# Patient Record
Sex: Male | Born: 1985
Health system: Southern US, Community
[De-identification: ages and names within clinical notes are randomized; demographics above are authoritative.]

## PROBLEM LIST (undated history)

## (undated) DIAGNOSIS — H9191 Unspecified hearing loss, right ear: Secondary | ICD-10-CM

## (undated) DIAGNOSIS — G4730 Sleep apnea, unspecified: Secondary | ICD-10-CM

## (undated) DIAGNOSIS — R12 Heartburn: Secondary | ICD-10-CM

## (undated) DIAGNOSIS — B009 Herpesviral infection, unspecified: Secondary | ICD-10-CM

## (undated) HISTORY — DX: Herpesviral infection, unspecified: B00.9

## (undated) HISTORY — DX: Heartburn: R12

## (undated) HISTORY — DX: Unspecified hearing loss, right ear: H91.91

## (undated) HISTORY — DX: Sleep apnea, unspecified: G47.30

---

## 2004-07-05 ENCOUNTER — Emergency Department: Payer: Self-pay | Admitting: Emergency Medicine

## 2004-09-25 ENCOUNTER — Emergency Department: Payer: Self-pay | Admitting: Emergency Medicine

## 2004-10-08 ENCOUNTER — Emergency Department: Payer: Self-pay | Admitting: Emergency Medicine

## 2004-10-08 ENCOUNTER — Other Ambulatory Visit: Payer: Self-pay

## 2004-10-24 ENCOUNTER — Emergency Department: Payer: Self-pay | Admitting: Emergency Medicine

## 2004-10-25 ENCOUNTER — Emergency Department: Payer: Self-pay | Admitting: Unknown Physician Specialty

## 2005-02-09 ENCOUNTER — Emergency Department: Payer: Self-pay | Admitting: Emergency Medicine

## 2005-02-10 ENCOUNTER — Emergency Department: Payer: Self-pay | Admitting: Internal Medicine

## 2005-08-25 IMAGING — CT CT HEAD WITHOUT CONTRAST
1 series · 16 of 30 positions shown, 20 images · non-contrast
Comparison: none

REASON FOR EXAM: weakness on left side  [HOSPITAL]
COMMENTS:  LMP: (Male)

PROCEDURE:     CT  - CT HEAD WITHOUT CONTRAST  - October 08, 2004 [DATE]
RESULT:        Unenhanced CT of the brain shows no evidence of acute
intracranial hemorrhage, midline shift or mass effect.  No skull fracture or
subgaleal hematoma.

[Series 2: without · axial · non-contrast · 0.42mm/px · z∈[-125,+15]mm · 16 of 32 slices shown, 20 images]
[im 2/32  brain]
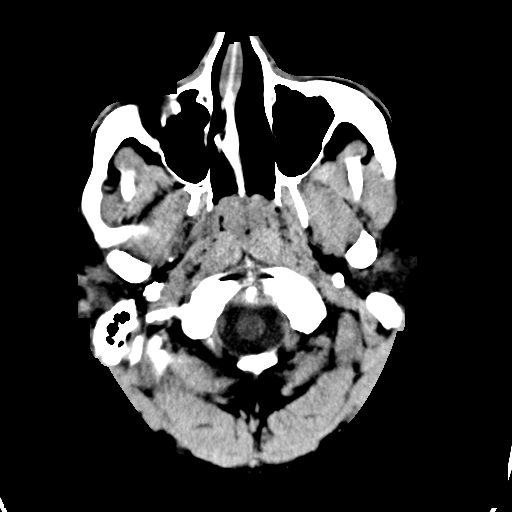
[im 2/32  bone]
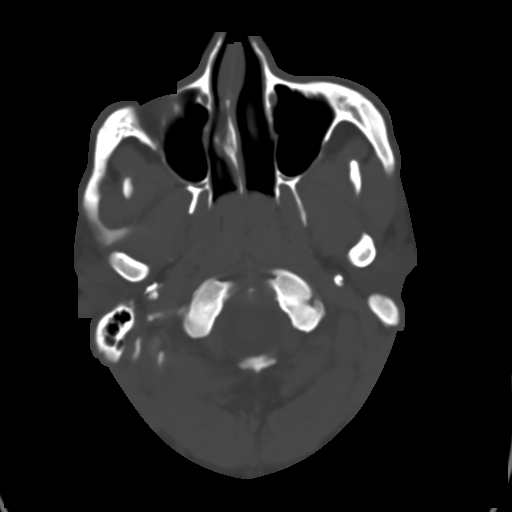
[im 4/32  brain]
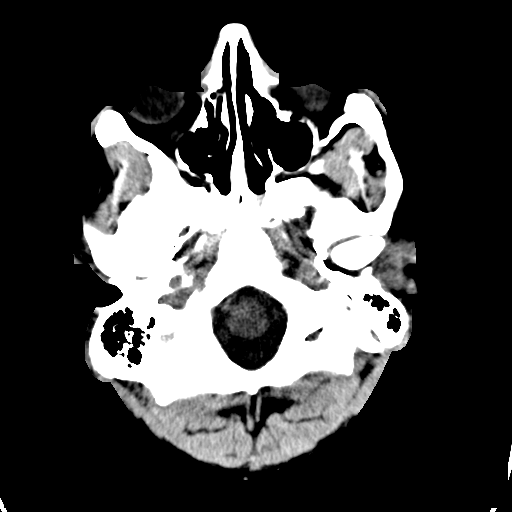
[im 6/32  brain]
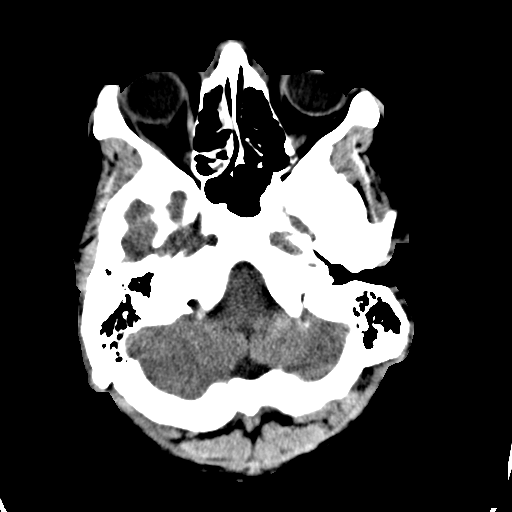
[im 8/32  brain]
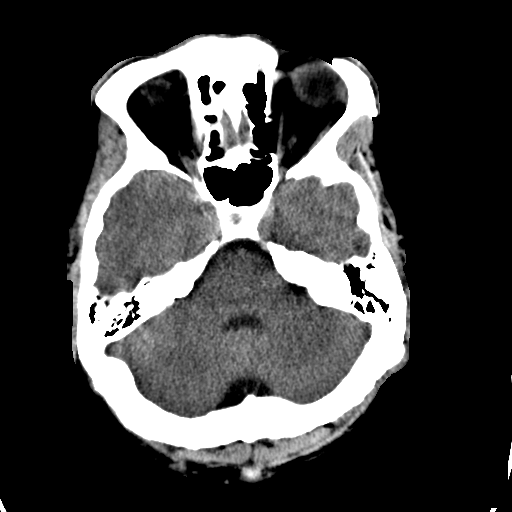
[im 9/32  brain]
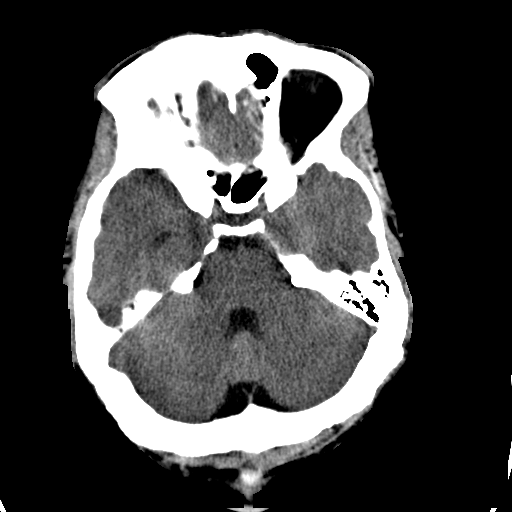
[im 9/32  bone]
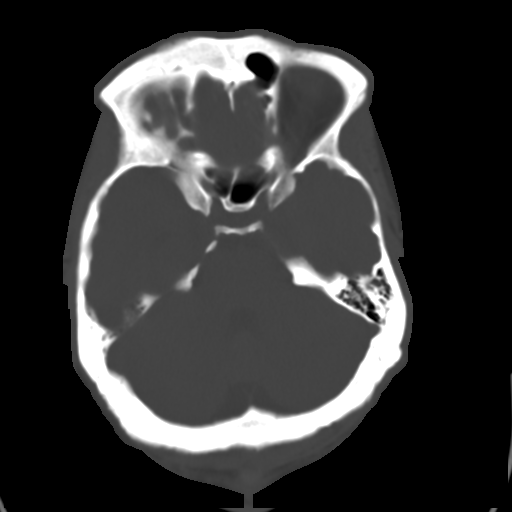
[im 11/32  brain]
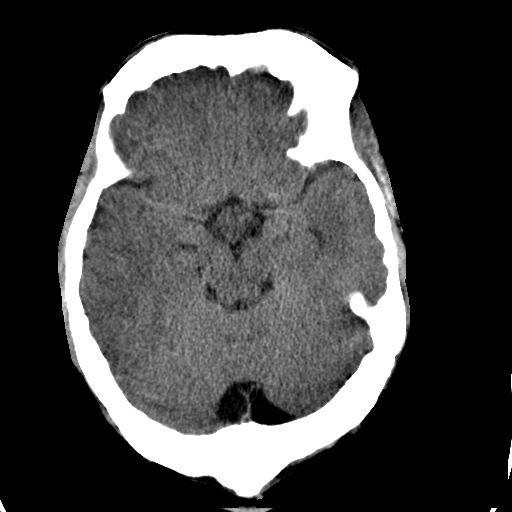
[im 13/32  brain]
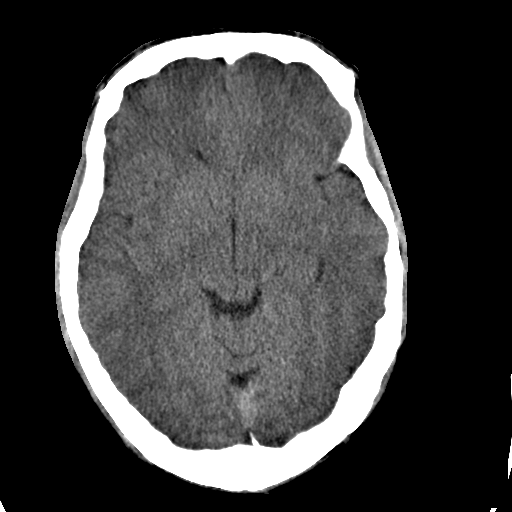
[im 15/32  brain]
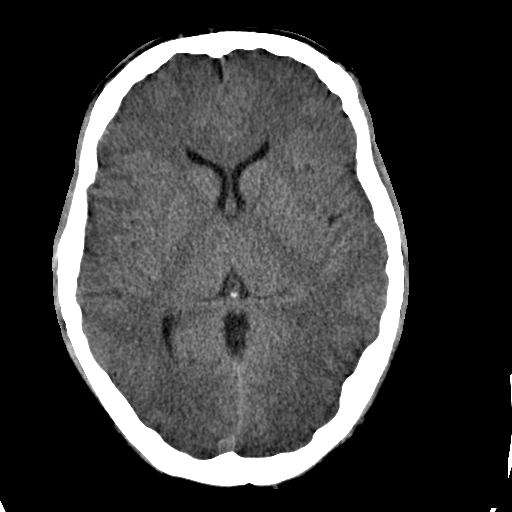
[im 17/32  brain]
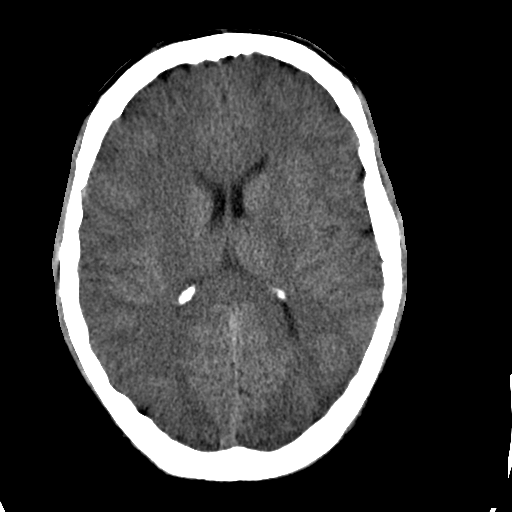
[im 17/32  bone]
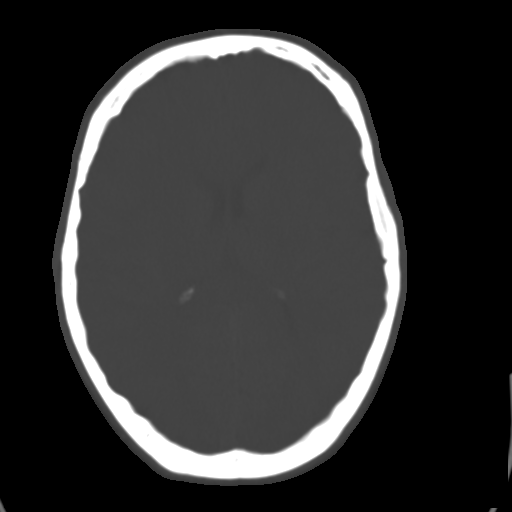
[im 19/32  brain]
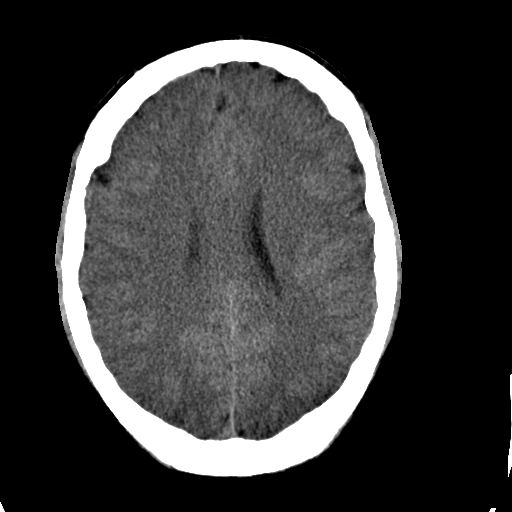
[im 21/32  brain]
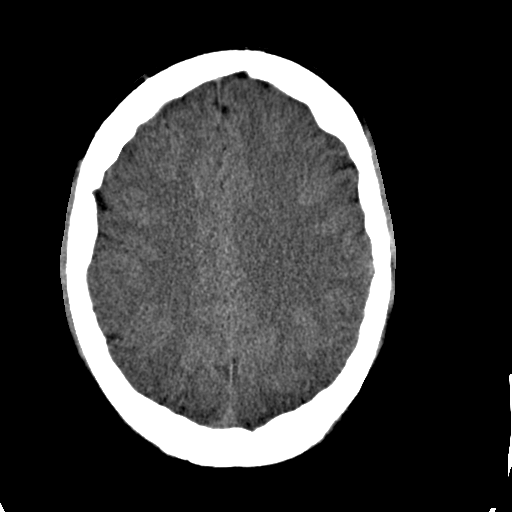
[im 23/32  brain]
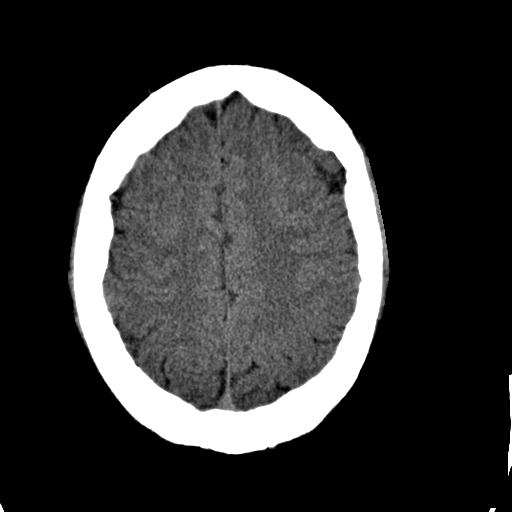
[im 24/32  brain]
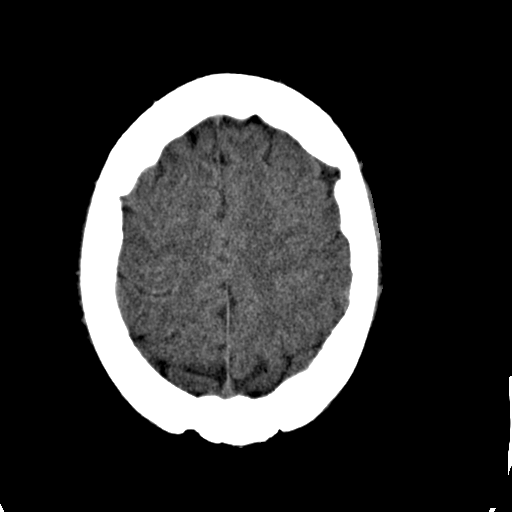
[im 24/32  bone]
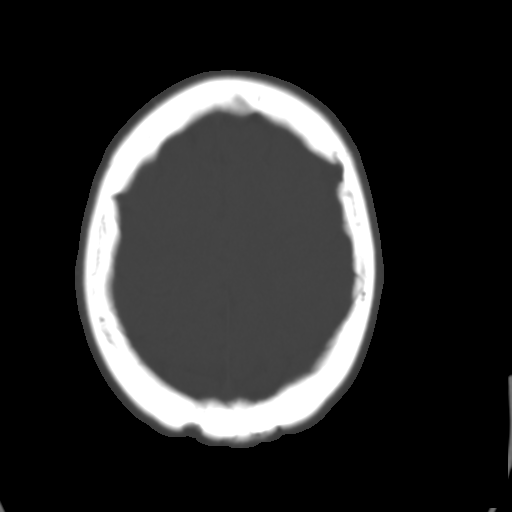
[im 26/32  brain]
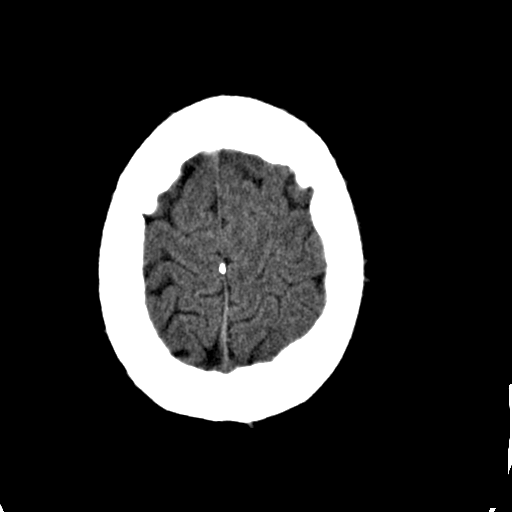
[im 28/32  brain]
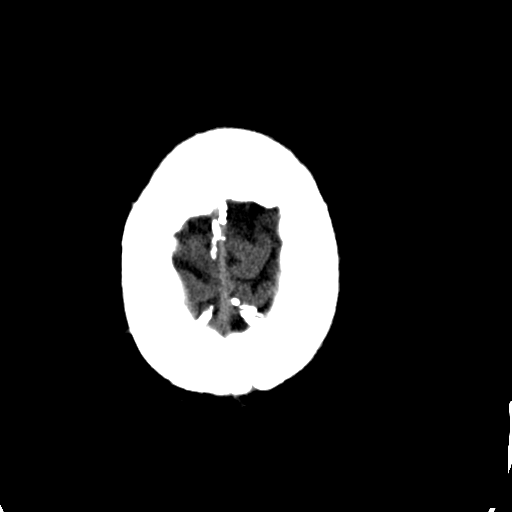
[im 30/32  brain]
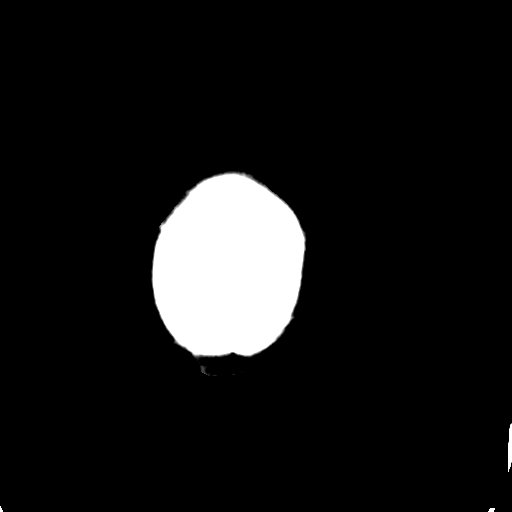

[16 of 30 positions shown; findings below may reference images not displayed]

IMPRESSION: Normal unenhanced CT brain.

## 2005-09-21 ENCOUNTER — Emergency Department: Payer: Self-pay | Admitting: Emergency Medicine

## 2005-09-24 ENCOUNTER — Emergency Department: Payer: Self-pay | Admitting: Unknown Physician Specialty

## 2005-12-28 ENCOUNTER — Emergency Department: Payer: Self-pay

## 2006-08-14 ENCOUNTER — Emergency Department: Payer: Self-pay | Admitting: Emergency Medicine

## 2007-04-05 ENCOUNTER — Emergency Department: Payer: Self-pay | Admitting: Emergency Medicine

## 2010-04-09 ENCOUNTER — Ambulatory Visit: Payer: Self-pay | Admitting: Internal Medicine

## 2010-05-22 ENCOUNTER — Emergency Department: Payer: Self-pay | Admitting: Emergency Medicine

## 2010-06-23 ENCOUNTER — Emergency Department: Payer: Self-pay | Admitting: Emergency Medicine

## 2011-05-12 ENCOUNTER — Emergency Department: Payer: Self-pay

## 2011-06-22 ENCOUNTER — Emergency Department: Payer: Self-pay | Admitting: Emergency Medicine

## 2011-08-19 ENCOUNTER — Emergency Department: Payer: Self-pay | Admitting: Emergency Medicine

## 2012-03-17 ENCOUNTER — Emergency Department: Payer: Self-pay | Admitting: Internal Medicine

## 2013-07-27 ENCOUNTER — Emergency Department: Payer: Self-pay | Admitting: Emergency Medicine

## 2014-11-07 ENCOUNTER — Encounter: Payer: Self-pay | Admitting: Emergency Medicine

## 2014-11-07 ENCOUNTER — Emergency Department
Admission: EM | Admit: 2014-11-07 | Discharge: 2014-11-07 | Disposition: A | Discharge status: Home / Self Care | Payer: Managed Care, Other (non HMO) | Attending: Emergency Medicine | Admitting: Emergency Medicine

## 2014-11-07 DIAGNOSIS — L539 Erythematous condition, unspecified: Secondary | ICD-10-CM | POA: Diagnosis present

## 2014-11-07 DIAGNOSIS — L03114 Cellulitis of left upper limb: Secondary | ICD-10-CM | POA: Diagnosis not present

## 2014-11-07 DIAGNOSIS — Z720 Tobacco use: Secondary | ICD-10-CM | POA: Insufficient documentation

## 2014-11-07 MED ORDER — SULFAMETHOXAZOLE-TRIMETHOPRIM 800-160 MG PO TABS: 2.0000 | ORAL_TABLET | Freq: Two times a day (BID) | ORAL | Status: AC

## 2014-11-07 NOTE — ED Provider Notes (Signed)
CSN: [id number]     Arrival date & time 11/07/14  1511 History   First MD Initiated Contact with Patient 11/07/14 1548     Chief Complaint  Patient presents with  . Skin Ulcer     HPI Comments: 29 year old male presents today complaining of swelling and redness to his left forearm. Reports he had a temporal to the area last week and he popped it. Since then the area has swollen and increased in size almost every day. He has been peeling the scab off and pushing fluid out of it. He has not had any fevers or body aches. No history of MRSA or recurrent cellulitis.  Patient is a 29 y.o. male presenting with rash. The history is provided by the patient.  Rash Location:  Shoulder/arm Shoulder/arm rash location:  L forearm Quality: painful, redness, swelling and weeping   Pain details:    Quality:  Burning   Severity:  Moderate   Onset quality:  Gradual   Duration:  7 days   Timing:  Constant   Progression:  Unchanged Severity:  Mild Onset quality:  Gradual Timing:  Constant Progression:  Unchanged Chronicity:  New Relieved by:  None tried Worsened by:  Contact Ineffective treatments:  None tried Associated symptoms: no fever, no joint pain and no myalgias     History reviewed. No pertinent past medical history. History reviewed. No pertinent past surgical history. No family history on file. History  Substance Use Topics  . Smoking status: Current Every Day Smoker -- 1.00 packs/day    Types: Cigarettes  . Smokeless tobacco: Not on file  . Alcohol Use: No    Review of Systems  Constitutional: Negative for fever and chills.  Musculoskeletal: Negative for myalgias and arthralgias.  Skin: Positive for rash.  All other systems reviewed and are negative.     Allergies  Review of patient's allergies indicates no known allergies.  Home Medications   Prior to Admission medications   Medication Sig Start Date End Date Taking? Authorizing Provider    sulfamethoxazole-trimethoprim (BACTRIM DS) 800-160 MG per tablet Take 2 tablets by mouth 2 (two) times daily. 11/07/14 11/17/14  Wilber Oliphant V, PA-C   BP 128/91 mmHg  Pulse 82  Temp(Src) 98.6 F (37 C) (Oral)  Resp 18  Ht  (1.753 m)  Wt 260 lb (117.935 kg)  BMI 38.38 kg/m2  SpO2 98% Physical Exam  Constitutional: He is oriented to person, place, and time. Vital signs are normal. He appears well-developed and well-nourished. He is active.  Non-toxic appearance. He does not have a sickly appearance. He does not appear ill.  HENT:  Head: Normocephalic and atraumatic.  Musculoskeletal: Normal range of motion. He exhibits no tenderness.  Neurological: He is alert and oriented to person, place, and time.  Skin: Skin is warm and dry. There is erythema.     Psychiatric: He has a normal mood and affect. His behavior is normal. Judgment and thought content normal.  Nursing note and vitals reviewed.   ED Course  Procedures (including critical care time) Labs Review Labs Reviewed - No data to display  Imaging Review No results found.   EKG Interpretation None      MDM  There is no indication for incision and drainage of this area currently. I advised patient to use warm compresses several times per day. He is to take Tylenol or ibuprofen for pain. He is prescribed Bactrim double strength 2 pills twice a day for 10 days. He will  return for increased pain, swelling or increased area of redness. Final diagnoses:  Cellulitis of left upper extremity       Luvenia Redden, PA-C 11/07/14 1604  Darien Ramus, MD 11/07/14 2221

## 2014-11-07 NOTE — ED Notes (Signed)
Pt has abcess to left forearm.  States i had a pimple last week and popped it.  It continues to have some drainage and now it's red and tender.

## 2014-11-07 NOTE — Discharge Instructions (Signed)
Abscess °An abscess (boil or furuncle) is an infected area on or under the skin. This area is filled with yellowish-white fluid (pus) and other material (debris). °HOME CARE  °· Only take medicines as told by your doctor. °· If you were given antibiotic medicine, take it as directed. Finish the medicine even if you start to feel better. °· If gauze is used, follow your doctor's directions for changing the gauze. °· To avoid spreading the infection: °¨ Keep your abscess covered with a bandage. °¨ Wash your hands well. °¨ Do not share personal care items, towels, or whirlpools with others. °¨ Avoid skin contact with others. °· Keep your skin and clothes clean around the abscess. °· Keep all doctor visits as told. °GET HELP RIGHT AWAY IF:  °· You have more pain, puffiness (swelling), or redness in the wound site. °· You have more fluid or blood coming from the wound site. °· You have muscle aches, chills, or you feel sick. °· You have a fever. °MAKE SURE YOU:  °· Understand these instructions. °· Will watch your condition. °· Will get help right away if you are not doing well or get worse. °Document Released: 10/27/2007 Document Revised: 11/09/2011 Document Reviewed: 07/23/2011 °ExitCare® Patient Information ©2015 [employer]. This information is not intended to replace advice given to you by your health care provider. Make sure you discuss any questions you have with your health care provider. ° °

## 2014-11-07 NOTE — ED Notes (Signed)
Red abscess to left forearm, pt states it started out as a pimple, he popped it and now its inflammed, only draining when squeezed.

## 2015-04-15 ENCOUNTER — Ambulatory Visit (INDEPENDENT_AMBULATORY_CARE_PROVIDER_SITE_OTHER): Payer: Managed Care, Other (non HMO) | Admitting: Family Medicine

## 2015-04-15 ENCOUNTER — Encounter: Payer: Self-pay | Admitting: Family Medicine

## 2015-04-15 VITALS — BP 119/81 | HR 73 | Temp 97.0°F | Ht 69.0 in | Wt 263.0 lb

## 2015-04-15 DIAGNOSIS — H9191 Unspecified hearing loss, right ear: Secondary | ICD-10-CM | POA: Diagnosis not present

## 2015-04-15 DIAGNOSIS — Z0000 Encounter for general adult medical examination without abnormal findings: Secondary | ICD-10-CM

## 2015-04-15 DIAGNOSIS — G4733 Obstructive sleep apnea (adult) (pediatric): Secondary | ICD-10-CM | POA: Diagnosis not present

## 2015-04-15 DIAGNOSIS — Z720 Tobacco use: Secondary | ICD-10-CM | POA: Diagnosis not present

## 2015-04-15 DIAGNOSIS — Z9989 Dependence on other enabling machines and devices: Principal | ICD-10-CM

## 2015-04-15 NOTE — Assessment & Plan Note (Signed)
Encouraged cessation.

## 2015-04-15 NOTE — Assessment & Plan Note (Addendum)
Refer to ENT/audiologist for hearing evaluation and hearing aide if appropriate

## 2015-04-15 NOTE — Patient Instructions (Addendum)
There are quitting cessation classes for free at the hospital, [phone] You can also call 1-800-QUIT-NOW for assistance No flavored e-cigarettes Return for fasting labs in the next few weeks We'll get you to an audiologist and we'll get the CPAP ordered I do recommend yearly flu shots; for individuals who don't want flu shots, try to practice excellent hand hygiene, and avoid nursing homes, day cares, and hospitals during peak flu season; taking vitamin C daily during flu/cold season may help boost your immune system too Return in one year for follow-up, physical

## 2015-04-15 NOTE — Progress Notes (Signed)
BP 119/81 mmHg  Pulse 73  Temp(Src) 97 F (36.1 C)  Ht  (1.753 m)  Wt 263 lb (119.296 kg)  BMI 38.82 kg/m2  SpO2 98%   Subjective:    Patient ID: Peter Jones, male    DOB: 02-Oct-1985, 29 y.o.   MRN: [id number]  HPI: MOSIAH BASTIN is a 29 y.o. male  Chief Complaint  Patient presents with  . 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 His mother has diabetes Allergies and medications reviewed and updated.  Review of Systems Per HPI unless specifically indicated above     Objective:    BP 119/81 mmHg  Pulse 73  Temp(Src) 97 F (36.1 C)  Ht  (1.753 m)  Wt 263 lb (119.296 kg)  BMI 38.82 kg/m2  SpO2 98%  Wt Readings from Last 3 Encounters:  04/15/15 263 lb (119.296 kg)  11/07/14 260 lb (117.935 kg)    Physical Exam  Constitutional: He appears well-developed and well-nourished. No distress.  obese  HENT:  Head: Normocephalic and atraumatic.  Eyes: EOM are normal. No scleral icterus.  Cardiovascular: Normal rate and regular rhythm.   Pulmonary/Chest: Effort normal and breath sounds normal.  Musculoskeletal: He exhibits no edema.  Neurological: He is alert.  Skin: No pallor.  Psychiatric: He has a normal mood and affect. His behavior is normal.  Judgment and thought content normal.    No results found for this or any previous visit.    Assessment & Plan:   Problem List Items Addressed This Visit      Respiratory   Obstructive sleep apnea on CPAP - Primary    Get him equipment for CPAP including new machine and tubing; since last sleep study was five years ago and he has hearing loss, will refer to ENT to arrange for treatment, monitoring, prescribing of CPAP equipment; not sure if he would benefit from repeat sleep study since last was five years ago      Relevant Orders   Ambulatory referral to ENT     Other   Tobacco abuse    Encouraged cessation      Hearing loss in right ear    Refer to ENT/audiologist for hearing evaluation and hearing aide if appropriate      Relevant Orders   Ambulatory referral to ENT    Other Visit Diagnoses    Preventative health care        check yearly labs; patient not fasting, but he will return to have these done in the next few weeks    Relevant Orders    CBC with Differential/Platelet    Lipid Panel w/o Chol/HDL Ratio    Comprehensive metabolic panel    TSH  Follow up plan: Return in about 1 year (around 04/14/2016) for complete physical.  An after-visit summary was printed and given to the patient at check-out.  Please see the patient instructions which may contain other information and recommendations beyond what is mentioned above in the assessment and plan.  Orders Placed This Encounter  Procedures  . CBC with Differential/Platelet  . Lipid Panel w/o Chol/HDL Ratio  . Comprehensive metabolic panel  . TSH  . Ambulatory referral to ENT   Face-to-face time with patient was more than 20 minutes, >50% time spent counseling and coordination of care

## 2015-04-15 NOTE — Assessment & Plan Note (Addendum)
Get him equipment for CPAP including new machine and tubing; since last sleep study was five years ago and he has hearing loss, will refer to ENT to arrange for treatment, monitoring, prescribing of CPAP equipment; not sure if he would benefit from repeat sleep study since last was five years ago

## 2015-06-27 ENCOUNTER — Encounter: Payer: Self-pay | Admitting: Family Medicine

## 2015-08-12 ENCOUNTER — Telehealth: Payer: Self-pay | Admitting: Family Medicine

## 2015-08-12 NOTE — Telephone Encounter (Signed)
Left message to come in for fasting labs.

## 2015-08-12 NOTE — Telephone Encounter (Signed)
Please remind patient that he has outstanding lab orders; please have those done in the next week; thank you

## 2016-03-16 ENCOUNTER — Ambulatory Visit (INDEPENDENT_AMBULATORY_CARE_PROVIDER_SITE_OTHER): Payer: Managed Care, Other (non HMO) | Admitting: Family Medicine

## 2016-03-16 ENCOUNTER — Encounter: Payer: Self-pay | Admitting: Family Medicine

## 2016-03-16 DIAGNOSIS — E669 Obesity, unspecified: Secondary | ICD-10-CM | POA: Insufficient documentation

## 2016-03-16 DIAGNOSIS — E6609 Other obesity due to excess calories: Secondary | ICD-10-CM | POA: Diagnosis not present

## 2016-03-16 DIAGNOSIS — Z720 Tobacco use: Secondary | ICD-10-CM | POA: Diagnosis not present

## 2016-03-16 DIAGNOSIS — Z6834 Body mass index (BMI) 34.0-34.9, adult: Secondary | ICD-10-CM | POA: Diagnosis not present

## 2016-03-16 DIAGNOSIS — Z113 Encounter for screening for infections with a predominantly sexual mode of transmission: Secondary | ICD-10-CM | POA: Insufficient documentation

## 2016-03-16 DIAGNOSIS — M545 Low back pain, unspecified: Secondary | ICD-10-CM | POA: Insufficient documentation

## 2016-03-16 DIAGNOSIS — G4733 Obstructive sleep apnea (adult) (pediatric): Secondary | ICD-10-CM

## 2016-03-16 DIAGNOSIS — Z9989 Dependence on other enabling machines and devices: Secondary | ICD-10-CM

## 2016-03-16 DIAGNOSIS — G8929 Other chronic pain: Secondary | ICD-10-CM | POA: Diagnosis not present

## 2016-03-16 DIAGNOSIS — M7652 Patellar tendinitis, left knee: Secondary | ICD-10-CM | POA: Diagnosis not present

## 2016-03-16 LAB — COMPLETE METABOLIC PANEL WITH GFR
ALK PHOS: 30 U/L — AB (ref 40–115)
ALT: 19 U/L (ref 9–46)
AST: 18 U/L (ref 10–40)
Albumin: 4.3 g/dL (ref 3.6–5.1)
BUN: 8 mg/dL (ref 7–25)
CALCIUM: 9.4 mg/dL (ref 8.6–10.3)
CO2: 25 mmol/L (ref 20–31)
Chloride: 104 mmol/L (ref 98–110)
Creat: 0.99 mg/dL (ref 0.60–1.35)
GFR, Est African American: >89 mL/min (ref >=60)
Glucose, Bld: 89 mg/dL (ref 65–99)
POTASSIUM: 4.6 mmol/L (ref 3.5–5.3)
Sodium: 139 mmol/L (ref 135–146)
Total Bilirubin: 0.3 mg/dL (ref 0.2–1.2)
Total Protein: 6.9 g/dL (ref 6.1–8.1)

## 2016-03-16 LAB — LIPID PANEL
Cholesterol: 93 mg/dL — ABNORMAL LOW (ref 125–200)
HDL: 20 mg/dL — ABNORMAL LOW (ref >=40)
LDL Cholesterol: 43 mg/dL (ref <130)
TRIGLYCERIDES: 152 mg/dL — AB (ref <150)
Total CHOL/HDL Ratio: 4.7 Ratio (ref <=5.0)
VLDL: 30 mg/dL (ref <30)

## 2016-03-16 NOTE — Progress Notes (Signed)
BP 132/80   Pulse 80   Temp 98.2 F (36.8 C) (Oral)   Resp 16   Wt 260 lb (117.9 kg)   SpO2 96%   BMI 38.40 kg/m    Subjective:    Patient ID: Peter Jones, male    DOB: 26-Nov-1985, 30 y.o.   MRN: [id number]  HPI: Peter Jones is a 30 y.o. male  Chief Complaint  Patient presents with  . Follow-up   He is here for follow-up and will do some fasting labs, last food intake was 1:15 am; did drink some coffee and had some coffee, 3 tablespoons of sugar Feeling good overall Never completed the paperwork for CPAP; he just needs to get the paperwork signed to get the machine, had the study and has the settings and all; weight is overall stable since last year Obesity; he got back into the gym, got down to 246 but then stopped going for a month and gained back up; diet was pretty much the same; will plan to get back into the gym; no CP or SHOB; watches heart rate on the machines He just got out of a relationship; relations with another woman; asx; using condoms Blood pressure elevated today; nervous about asking about STDs Left knee at times will have a sharp burning pain across the patella in the midline, above and below; 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 Deafness in right ear    50%  . Heart burn   . Sleep apnea    History reviewed. No pertinent surgical history. Family History  Problem Relation Age of Onset  . COPD Mother   . Diabetes Mother   . Heart disease Mother    . Hypertension Mother   . Cancer Father   . Stroke Neg Hx    Social History  Substance Use Topics  . Smoking status: Current Every Day Smoker    Packs/day: 1.00    Years: 13.00    Types: Cigarettes  . Smokeless tobacco: Never Used  . Alcohol use No  MD note: 1 ppd, not ready to quit right now  Interim medical history since last visit reviewed. Allergies and medications reviewed  Review of Systems Per HPI unless specifically indicated above     Objective:    BP 132/80   Pulse 80   Temp 98.2 F (36.8 C) (Oral)   Resp 16   Wt 260 lb (117.9 kg)   SpO2 96%   BMI 38.40 kg/m   Wt Readings from Last 3 Encounters:  03/16/16 260 lb (117.9 kg)  04/15/15 263 lb (119.3 kg)  11/07/14 260 lb (117.9 kg)    Physical Exam  Constitutional: He appears well-developed and well-nourished. No distress.  HENT:  Head: Normocephalic and atraumatic.  Eyes: EOM are normal. No scleral icterus.  Neck: No thyromegaly present.  Cardiovascular: Normal rate and regular rhythm.   Pulmonary/Chest: Effort normal and breath sounds normal.  Abdominal: Soft. Bowel sounds are  normal. He exhibits no distension.  Musculoskeletal: He exhibits no edema.       Left knee: He exhibits normal range of motion, no swelling, no effusion, no deformity and normal alignment. No tenderness found.       Lumbar back: He exhibits normal range of motion, no tenderness, no bony tenderness, no swelling and no deformity.  Neurological: Coordination normal.  Skin: Skin is warm and dry. No pallor.  Psychiatric: He has a normal mood and affect. His behavior is normal. Judgment and thought content normal.   No results found for this or any previous visit.    Assessment & Plan:   Problem List Items Addressed This Visit      Respiratory   Obstructive sleep apnea on CPAP    Patient does not have machine; I urged him to get his paperwork filled out to get back on CPAP; I asked if there was anything we could do to help and  he says he'll do it        Musculoskeletal and Integument   Patellar tendonitis of left knee    encoruaged exercises; NSAID per package directions        Other   Tobacco abuse    Patient is not ready to quit; I am here to help with Chantix or other Rx if needed; see AVS for QUITNOW # and smoking cessation class # given      Screen for STD (sexually transmitted disease)    Encouraged condom use to prevent STDs; will check labs today      Relevant Orders   RPR   Hepatitis panel, acute   GC/Chlamydia Probe Amp   HIV antibody   Obesity    Check glucose and lipids      Relevant Orders   COMPLETE METABOLIC PANEL WITH GFR   Lipid panel   Low back pain    Check urine; offered PT for strengthening; pt politely declined, but I'll leave open invitation on chart      Relevant Orders   Urinalysis w microscopic + reflex cultur    Other Visit Diagnoses   None.      Follow up plan: Return in about 6 months (around 09/14/2016) for complete physical.  An after-visit summary was printed and given to the patient at check-out.  Please see the patient instructions which may contain other information and recommendations beyond what is mentioned above in the assessment and plan.  No orders of the defined types were placed in this encounter.   Orders Placed This Encounter  Procedures  . GC/Chlamydia Probe Amp  . COMPLETE METABOLIC PANEL WITH GFR  . RPR  . Hepatitis panel, acute  . HIV antibody  . Lipid panel  . Urinalysis w microscopic + reflex cultur

## 2016-03-16 NOTE — Assessment & Plan Note (Signed)
Encouraged condom use to prevent STDs; will check labs today

## 2016-03-16 NOTE — Assessment & Plan Note (Signed)
Check urine; offered PT for strengthening; pt politely declined, but I'll leave open invitation on chart

## 2016-03-16 NOTE — Assessment & Plan Note (Signed)
encoruaged exercises; NSAID per package directions

## 2016-03-16 NOTE — Assessment & Plan Note (Signed)
Patient is not ready to quit; I am here to help with Chantix or other Rx if needed; see AVS for QUITNOW # and smoking cessation class # given

## 2016-03-16 NOTE — Assessment & Plan Note (Signed)
Check glucose and lipids 

## 2016-03-16 NOTE — Assessment & Plan Note (Signed)
Patient does not have machine; I urged him to get his paperwork filled out to get back on CPAP; I asked if there was anything we could do to help and he says he'll do it

## 2016-03-16 NOTE — Patient Instructions (Addendum)
I do encourage you to quit smoking Call [phone] to sign up for smoking cessation classes You can call 1-800-QUIT-NOW to talk with a smoking cessation coach  Please do contact the sleep lab about getting CPAP  For your knee, do the exercises daily; use over-the-counter NSAIDs per package directions and ice topically 15-20 minutes 3-4 times a day for 4-7 days to help relieve flares  We'll get labs today If you have not heard anything from my staff in a week about any orders/referrals/studies from today, please contact us here to follow-up [phone]  Your goal blood pressure is less than 140 mmHg on top. Try to follow the DASH guidelines (DASH stands for Dietary Approaches to Stop Hypertension) Try to limit the sodium in your diet.  Ideally, consume less than 1.5 grams (less than 1,500mg ) per day. Do not add salt when cooking or at the table.  Check the sodium amount on labels when shopping, and choose items lower in sodium when given a choice. Avoid or limit foods that already contain a lot of sodium. Eat a diet rich in fruits and vegetables and whole grains.  Check out the information at familydoctor.org entitled "Nutrition for Weight Loss: What You Need to Know about Fad Diets" Try to lose between 1-2 pounds per week by taking in fewer calories and burning off more calories You can succeed by limiting portions, limiting foods dense in calories and fat, becoming more active, and drinking 8 glasses of water a day (64 ounces) Don't skip meals, especially breakfast, as skipping meals may alter your metabolism Do not use over-the-counter weight loss pills or gimmicks that claim rapid weight loss A healthy BMI (or body mass index) is between 18.5 and 24.9 You can calculate your ideal BMI at the NIH website [employer]huhttp://www.nhlbi.nih.gov/health/educational/lose_wt/BMI/bmicalc.htm

## 2016-03-17 LAB — URINALYSIS W MICROSCOPIC + REFLEX CULTURE
BACTERIA UA: NONE SEEN [HPF]
Bilirubin Urine: NEGATIVE
CRYSTALS: NONE SEEN [HPF]
Casts: NONE SEEN [LPF]
GLUCOSE, UA: NEGATIVE
Hgb urine dipstick: NEGATIVE
Ketones, ur: NEGATIVE
LEUKOCYTES UA: NEGATIVE
Nitrite: NEGATIVE
Protein, ur: NEGATIVE
RBC / HPF: NONE SEEN RBC/HPF (ref <=2)
Specific Gravity, Urine: 1.011 (ref 1.001–1.035)
Squamous Epithelial / LPF: NONE SEEN [HPF] (ref <=5)
WBC, UA: NONE SEEN WBC/HPF (ref <=5)
YEAST: NONE SEEN [HPF]
pH: 7 (ref 5.0–8.0)

## 2016-03-17 LAB — HEPATITIS PANEL, ACUTE
HCV Ab: NEGATIVE
HEP B C IGM: NONREACTIVE
HEP B S AG: NEGATIVE
Hep A IgM: NONREACTIVE

## 2016-03-17 LAB — RPR

## 2016-03-17 LAB — HIV ANTIBODY (ROUTINE TESTING W REFLEX): HIV 1&2 Ab, 4th Generation: NONREACTIVE

## 2016-03-17 LAB — GC/CHLAMYDIA PROBE AMP
CT PROBE, AMP APTIMA: NOT DETECTED
GC Probe RNA: NOT DETECTED

## 2016-03-31 ENCOUNTER — Encounter: Payer: Self-pay | Admitting: Family Medicine

## 2016-03-31 DIAGNOSIS — Z8679 Personal history of other diseases of the circulatory system: Secondary | ICD-10-CM | POA: Insufficient documentation

## 2016-03-31 DIAGNOSIS — I517 Cardiomegaly: Secondary | ICD-10-CM | POA: Insufficient documentation

## 2016-04-05 ENCOUNTER — Encounter: Payer: Self-pay | Admitting: Family Medicine

## 2016-04-14 ENCOUNTER — Other Ambulatory Visit: Payer: Self-pay | Admitting: Family Medicine

## 2016-04-14 DIAGNOSIS — Z9989 Dependence on other enabling machines and devices: Principal | ICD-10-CM

## 2016-04-14 DIAGNOSIS — G4733 Obstructive sleep apnea (adult) (pediatric): Secondary | ICD-10-CM

## 2016-04-14 NOTE — Assessment & Plan Note (Signed)
Refer to ENT for OSA, CPAP

## 2016-06-07 ENCOUNTER — Telehealth: Payer: Self-pay

## 2016-06-07 NOTE — Telephone Encounter (Signed)
Error

## 2016-06-10 ENCOUNTER — Ambulatory Visit: Payer: Self-pay | Admitting: Family Medicine

## 2016-06-17 ENCOUNTER — Encounter: Payer: Self-pay | Admitting: Family Medicine

## 2016-06-17 ENCOUNTER — Ambulatory Visit (INDEPENDENT_AMBULATORY_CARE_PROVIDER_SITE_OTHER): Payer: Commercial Managed Care - PPO | Admitting: Family Medicine

## 2016-06-17 VITALS — BP 114/78 | HR 82 | Temp 98.1°F | Resp 14 | Wt 261.4 lb

## 2016-06-17 DIAGNOSIS — E6609 Other obesity due to excess calories: Secondary | ICD-10-CM

## 2016-06-17 DIAGNOSIS — Z113 Encounter for screening for infections with a predominantly sexual mode of transmission: Secondary | ICD-10-CM | POA: Diagnosis not present

## 2016-06-17 DIAGNOSIS — Z6838 Body mass index (BMI) 38.0-38.9, adult: Secondary | ICD-10-CM

## 2016-06-17 DIAGNOSIS — Z114 Encounter for screening for human immunodeficiency virus [HIV]: Secondary | ICD-10-CM

## 2016-06-17 DIAGNOSIS — K909 Intestinal malabsorption, unspecified: Secondary | ICD-10-CM

## 2016-06-17 DIAGNOSIS — K9049 Malabsorption due to intolerance, not elsewhere classified: Secondary | ICD-10-CM | POA: Insufficient documentation

## 2016-06-17 LAB — HEPATITIS PANEL, ACUTE
HCV AB: NEGATIVE
HEP B S AG: NEGATIVE
Hep A IgM: NONREACTIVE
Hep B C IgM: NONREACTIVE

## 2016-06-17 NOTE — Assessment & Plan Note (Signed)
Check labs today; condom use encouraged every sexual encounter

## 2016-06-17 NOTE — Assessment & Plan Note (Signed)
Try going completely dairy free for 4 weeks; if still having mucous build-up, contact me

## 2016-06-17 NOTE — Assessment & Plan Note (Signed)
Encouraged weight loss in AVS

## 2016-06-17 NOTE — Progress Notes (Signed)
BP 114/78   Pulse 82   Temp 98.1 F (36.7 C) (Oral)   Resp 14   Wt 261 lb 6 oz (118.6 kg)   SpO2 97%   BMI 38.60 kg/m    Subjective:    Patient ID: Peter Jones, male    DOB: 05/04/1986, 31 y.o.   MRN: [id number]  HPI: Peter Jones is a 31 y.o. male  Chief Complaint  Patient presents with  . Exposure to STD    STD check through blood work    He is here for STD check; no symptoms He says that his partner had something that can cause cervical cancer She also has herpes as well No genital warts, just ingrown hair No blisters or ulcers; partner not active outbreak Used a condom some of the times No burning with urination No fevers No swollen glands He also has increased mucous production after eating, things like bologna and cheese  Depression screen [facility] 2/9 06/17/2016 03/16/2016 04/15/2015  Decreased Interest 0 0 0  Down, Depressed, Hopeless 0 0 0  PHQ - 2 Score 0 0 0   Relevant past medical, surgical, family and social history reviewed Past Medical History:  Diagnosis Date  . Deafness in right ear    50%  . Heart burn   . Sleep apnea    History reviewed. No pertinent surgical history.   Family History  Problem Relation Age of Onset  . COPD Mother   . Diabetes Mother   . Heart disease Mother   . Hypertension Mother   . Cancer Father   . Stroke Neg Hx    Social History  Substance Use Topics  . Smoking status: Current Every Day Smoker    Packs/day: 1.00    Years: 13.00    Types: Cigarettes  . Smokeless tobacco: Never Used  . Alcohol use No   Interim medical history since last visit reviewed. Allergies and medications reviewed  Review of Systems Per HPI unless specifically indicated above     Objective:    BP 114/78   Pulse 82   Temp 98.1 F (36.7 C) (Oral)   Resp 14   Wt 261 lb 6 oz (118.6 kg)   SpO2 97%   BMI 38.60 kg/m   Wt Readings from Last 3 Encounters:  06/17/16 261 lb 6 oz (118.6 kg)  03/16/16 260 lb (117.9 kg)  04/15/15 263  lb (119.3 kg)    Physical Exam  Constitutional: He appears well-developed and well-nourished. No distress.  obese  Eyes: No scleral icterus.  Cardiovascular: Normal rate and regular rhythm.   Pulmonary/Chest: Effort normal and breath sounds normal.  Neurological: He is alert.  Psychiatric: He has a normal mood and affect.   Results for orders placed or performed in visit on 03/16/16  GC/Chlamydia Probe Amp  Result Value Ref Range   CT Probe RNA NOT DETECTED    GC Probe RNA NOT DETECTED   COMPLETE METABOLIC PANEL WITH GFR  Result Value Ref Range   Sodium 139 135 - 146 mmol/L   Potassium 4.6 3.5 - 5.3 mmol/L   Chloride 104 98 - 110 mmol/L   CO2 25 20 - 31 mmol/L   Glucose, Bld 89 65 - 99 mg/dL   BUN 8 7 - 25 mg/dL   Creat 4.09 8.11 - 9.14 mg/dL   Total Bilirubin 0.3 0.2 - 1.2 mg/dL   Alkaline Phosphatase 30 (L) 40 - 115 U/L   AST 18 10 - 40 U/L  ALT 19 9 - 46 U/L   Total Protein 6.9 6.1 - 8.1 g/dL   Albumin 4.3 3.6 - 5.1 g/dL   Calcium 9.4 8.6 - 53.610.3 mg/dL   GFR, Est African American >89 >=60 mL/min   GFR, Est Non African American >89 >=60 mL/min  RPR  Result Value Ref Range   RPR Ser Ql NON REAC NON REAC  Hepatitis panel, acute  Result Value Ref Range   Hepatitis B Surface Ag NEGATIVE NEGATIVE   HCV Ab NEGATIVE NEGATIVE   Hep B C IgM NON REACTIVE NON REACTIVE   Hep A IgM NON REACTIVE NON REACTIVE  HIV antibody  Result Value Ref Range   HIV 1&2 Ab, 4th Generation NONREACTIVE NONREACTIVE  Lipid panel  Result Value Ref Range   Cholesterol 93 (L) 125 - 200 mg/dL   Triglycerides 644152 (H) <150 mg/dL   HDL 20 (L) >=03>=40 mg/dL   Total CHOL/HDL Ratio 4.7 <=5.0 Ratio   VLDL 30 <30 mg/dL   LDL Cholesterol 43 <474<130 mg/dL  Urinalysis w microscopic + reflex cultur  Result Value Ref Range   Color, Urine YELLOW YELLOW   APPearance CLEAR CLEAR   Specific Gravity, Urine 1.011 1.001 - 1.035   pH 7.0 5.0 - 8.0   Glucose, UA NEGATIVE NEGATIVE   Bilirubin Urine NEGATIVE NEGATIVE    Ketones, ur NEGATIVE NEGATIVE   Hgb urine dipstick NEGATIVE NEGATIVE   Protein, ur NEGATIVE NEGATIVE   Nitrite NEGATIVE NEGATIVE   Leukocytes, UA NEGATIVE NEGATIVE   WBC, UA NONE SEEN <=5 WBC/HPF   RBC / HPF NONE SEEN <=2 RBC/HPF   Squamous Epithelial / LPF NONE SEEN <=5 HPF   Bacteria, UA NONE SEEN NONE SEEN HPF   Crystals NONE SEEN NONE SEEN HPF   Casts NONE SEEN NONE SEEN LPF   Yeast NONE SEEN NONE SEEN HPF      Assessment & Plan:   Problem List Items Addressed This Visit      Other   Screen for STD (sexually transmitted disease)    Check labs today; condom use encouraged every sexual encounter      Relevant Orders   RPR   GC/Chlamydia Probe Amp   Urine cytology ancillary only   HIV antibody   Hepatitis, Acute   HSV(herpes simplex vrs) 1+2 ab-IgG   HSV(herpes simplex vrs) 1+2 ab-IgM   Obesity    Encouraged weight loss in AVS      Encounter for screening for HIV    Screen for HIV      Relevant Orders   HIV antibody   Dairy product intolerance - Primary    Try going completely dairy free for 4 weeks; if still having mucous build-up, contact me         Follow up plan: Return if symptoms worsen or fail to improve.  An after-visit summary was printed and given to the patient at check-out.  Please see the patient instructions which may contain other information and recommendations beyond what is mentioned above in the assessment and plan.  No orders of the defined types were placed in this encounter.   Orders Placed This Encounter  Procedures  . GC/Chlamydia Probe Amp  . RPR  . HIV antibody  . Hepatitis, Acute  . HSV(herpes simplex vrs) 1+2 ab-IgG  . HSV(herpes simplex vrs) 1+2 ab-IgM

## 2016-06-17 NOTE — Assessment & Plan Note (Signed)
Screen for HIV

## 2016-06-17 NOTE — Patient Instructions (Addendum)
Try going completely dairy free for 4 weeks If mucous production still an issue, contact me I'll encourage condom use with every sexual encounter Check out the information at familydoctor.org entitled "Nutrition for Weight Loss: What You Need to Know about Fad Diets" Try to lose between 1-2 pounds per week by taking in fewer calories and burning off more calories You can succeed by limiting portions, limiting foods dense in calories and fat, becoming more active, and drinking 8 glasses of water a day (64 ounces) Don't skip meals, especially breakfast, as skipping meals may alter your metabolism Do not use over-the-counter weight loss pills or gimmicks that claim rapid weight loss A healthy BMI (or body mass index) is between 18.5 and 24.9 You can calculate your ideal BMI at the NIH website JobE[employer]ics.hu   Sexually Transmitted Disease A sexually transmitted disease (STD) is a disease or infection often passed to another person during sex. However, STDs can be passed through nonsexual ways. An STD can be passed through:  Spit (saliva).  Semen.  Blood.  Mucus from the vagina.  Pee (urine). How can I lessen my chances of getting an STD?  Use:  Latex condoms.  Water-soluble lubricants with condoms. Do not use petroleum jelly or oils.  Dental dams. These are small pieces of latex that are used as a barrier during oral sex.  Avoid having more than one sex partner.  Do not have sex with someone who has other sex partners.  Do not have sex with anyone you do not know or who is at high risk for an STD.  Avoid risky sex that can break your skin.  Do not have sex if you have open sores on your mouth or skin.  Avoid drinking too much alcohol or taking illegal drugs. Alcohol and drugs can affect your good judgment.  Avoid oral and anal sex acts.  Get shots (vaccines) for HPV and hepatitis.  If you are at risk of being infected  with HIV, it is advised that you take a certain medicine daily to prevent HIV infection. This is called pre-exposure prophylaxis (PrEP). You may be at risk if:  You are a man who has sex with other men (MSM).  You are attracted to the opposite sex (heterosexual) and are having sex with more than one partner.  You take drugs with a needle.  You have sex with someone who has HIV.  Talk with your doctor about if you are at high risk of being infected with HIV. If you begin to take PrEP, get tested for HIV first. Get tested every 3 months for as long as you are taking PrEP.  Get tested for STDs every year if you are sexually active. If you are treated for an STD, get tested again 3 months after you are treated. What should I do if I think I have an STD?  See your doctor.  Tell your sex partner(s) that you have an STD. They should be tested and treated.  Do not have sex until your doctor says it is okay. When should I get help? Get help right away if:  You have bad belly (abdominal) pain.  You are a man and have puffiness (swelling) or pain in your testicles.  You are a woman and have puffiness in your vagina. This information is not intended to replace advice given to you by your health care provider. Make sure you discuss any questions you have with your health care provider. Document Released: 06/17/2004 Document  Revised: 10/16/2015 Document Reviewed: 11/03/2012 Elsevier Interactive Patient Education  2017 [employer]

## 2016-06-18 LAB — TRICHOMONAS VAGINALIS RNA, QL,MALES: Trichomonas vaginalis RNA: NOT DETECTED

## 2016-06-18 LAB — RPR

## 2016-06-18 LAB — GC/CHLAMYDIA PROBE AMP
CT Probe RNA: NOT DETECTED
GC Probe RNA: NOT DETECTED

## 2016-06-18 LAB — HSV(HERPES SIMPLEX VRS) I + II AB-IGG
HSV 1 Glycoprotein G Ab, IgG: 47.6 Index — ABNORMAL HIGH (ref <0.90)
HSV 2 Glycoprotein G Ab, IgG: <0.9 Index (ref <0.90)

## 2016-06-18 LAB — HIV ANTIBODY (ROUTINE TESTING W REFLEX): HIV 1&2 Ab, 4th Generation: NONREACTIVE

## 2016-06-20 LAB — HSV 1/2 AB (IGM), IFA W/RFLX TITER
HSV 1 IGM SCREEN: NEGATIVE
HSV 2 IGM SCREEN: NEGATIVE

## 2016-07-09 ENCOUNTER — Ambulatory Visit: Payer: Commercial Managed Care - PPO | Attending: Otolaryngology

## 2016-07-09 DIAGNOSIS — F5101 Primary insomnia: Secondary | ICD-10-CM | POA: Insufficient documentation

## 2016-07-09 DIAGNOSIS — R0683 Snoring: Secondary | ICD-10-CM | POA: Insufficient documentation

## 2016-07-09 DIAGNOSIS — G4700 Insomnia, unspecified: Secondary | ICD-10-CM | POA: Diagnosis present

## 2016-09-15 ENCOUNTER — Encounter: Payer: Managed Care, Other (non HMO) | Admitting: Family Medicine

## 2016-11-26 ENCOUNTER — Ambulatory Visit (INDEPENDENT_AMBULATORY_CARE_PROVIDER_SITE_OTHER): Payer: Commercial Managed Care - PPO | Admitting: Family Medicine

## 2016-11-26 ENCOUNTER — Ambulatory Visit
Admission: RE | Admit: 2016-11-26 | Discharge: 2016-11-26 | Disposition: A | Discharge status: Home / Self Care | Payer: Commercial Managed Care - PPO | Source: Ambulatory Visit | Attending: Family Medicine | Admitting: Family Medicine

## 2016-11-26 ENCOUNTER — Encounter: Payer: Self-pay | Admitting: Family Medicine

## 2016-11-26 VITALS — BP 126/72 | HR 92 | Temp 98.3°F | Resp 16 | Ht 69.0 in | Wt 262.3 lb

## 2016-11-26 DIAGNOSIS — M25562 Pain in left knee: Secondary | ICD-10-CM

## 2016-11-26 DIAGNOSIS — M7652 Patellar tendinitis, left knee: Secondary | ICD-10-CM | POA: Diagnosis not present

## 2016-11-26 DIAGNOSIS — Z6838 Body mass index (BMI) 38.0-38.9, adult: Secondary | ICD-10-CM | POA: Diagnosis not present

## 2016-11-26 DIAGNOSIS — K219 Gastro-esophageal reflux disease without esophagitis: Secondary | ICD-10-CM

## 2016-11-26 DIAGNOSIS — E6609 Other obesity due to excess calories: Secondary | ICD-10-CM | POA: Diagnosis not present

## 2016-11-26 DIAGNOSIS — B009 Herpesviral infection, unspecified: Secondary | ICD-10-CM

## 2016-11-26 DIAGNOSIS — Z720 Tobacco use: Secondary | ICD-10-CM | POA: Diagnosis not present

## 2016-11-26 MED ORDER — CELECOXIB 200 MG PO CAPS: 200.0000 mg | ORAL_CAPSULE | Freq: Every day | ORAL | 1 refills | Status: DC | PRN

## 2016-11-26 NOTE — Assessment & Plan Note (Signed)
Encouraged smoking cessation 

## 2016-11-26 NOTE — Progress Notes (Signed)
BP 126/72   Pulse 92   Temp 98.3 F (36.8 C) (Oral)   Resp 16   Ht 5\' 9"  (1.753 m)   Wt 262 lb 4.8 oz (119 kg)   SpO2 95%   BMI 38.73 kg/m    Subjective:    Patient ID: Peter Jones, male    DOB: 12/21/1985, 31 y.o.   MRN: [id number]  HPI: Peter Jones is a 31 y.o. male  Chief Complaint  Patient presents with  . Knee Pain    left    HPI Patient is here for an acute visit He c/o left knee pain Same pain that he had last fall  Goes up and down the cat walk all night long; one step is up off of the ground; he slowed himself down and then after the 3rd time doing that, could feel it; lowering himself down off the step; not worker's comp He has tried ice but that made it worse Tried heat with water jet from shower head, just temporary Tried wrapping it and it didn't help No redness but heat No hot now No hx of gout in him or family No boils or MRSA, no fevers, no night sweats  He smokes but is not interested in quitting at this time  He has herpes, wants to know about prevention  Acid reflux; stopped ibuprofen; no hx of ulcers  [facility] 2/9 11/26/2016 06/17/2016 03/16/2016 04/15/2015  Decreased Interest 0 0 0 0  Down, Depressed, Hopeless 0 0 0 0  PHQ - 2 Score 0 0 0 0    Relevant past medical, surgical, family and social history reviewed Past Medical History:  Diagnosis Date  . Deafness in right ear    50%  . Heart burn   . Herpes   . Sleep apnea    No past surgical history on file. Family History  Problem Relation Age of Onset  . COPD Mother   . Diabetes Mother   . Heart disease Mother   . Hypertension Mother   . Cancer Father        lung  . Asthma Son   . Autism Son   . Stroke Neg Hx    Social History   Social History  . Marital status: Legally Separated    Spouse name: N/A  . Number of children: N/A  . Years of education: N/A   Occupational History  . Not on file.   Social History Main Topics  . Smoking status: Current  Every Day Smoker    Packs/day: 1.00    Years: 13.00    Types: Cigarettes  . Smokeless tobacco: Never Used  . Alcohol use No  . Drug use: No  . Sexual activity: Not Currently   Other Topics Concern  . Not on file   Social History Narrative  . No narrative on file    Interim medical history since last visit reviewed. Allergies and medications reviewed  Review of Systems Per HPI unless specifically indicated above     Objective:    BP 126/72   Pulse 92   Temp 98.3 F (36.8 C) (Oral)   Resp 16   Ht 5\' 9"  (1.753 m)   Wt 262 lb 4.8 oz (119 kg)   SpO2 95%   BMI 38.73 kg/m   Wt Readings from Last 3 Encounters:  11/26/16 262 lb 4.8 oz (119 kg)  06/17/16 261 lb 6 oz (118.6 kg)  03/16/16 260 lb (117.9 kg)  Physical Exam  Constitutional: He appears well-developed and well-nourished. No distress.  Eyes: No scleral icterus.  Cardiovascular: Normal rate and regular rhythm.   Pulmonary/Chest: Effort normal and breath sounds normal.  Musculoskeletal:       Right knee: He exhibits normal range of motion, no swelling, no effusion, no deformity and no erythema.       Left knee: He exhibits decreased range of motion and effusion (small). He exhibits no swelling, no deformity, no erythema, no LCL laxity and no MCL laxity. No tenderness found.  Neurological: He is alert.  Skin: No pallor.  Psychiatric: He has a normal mood and affect.       Assessment & Plan:   Problem List Items Addressed This Visit      Digestive   Acid reflux    Because of acid reflux hx, will use celebrex as NSAID for knee        Musculoskeletal and Integument   Patellar tendonitis of left knee    Discussed s/s; will treat with NSAID, ice        Other   Tobacco abuse    Encouraged smoking cessation      Obesity    encourged weight loss      Herpes    Discussed mode of transmission, ways to decrease spread, risk of spread even with precautions; l-lysine or b complex stress tabs suggested;  questions answered       Other Visit Diagnoses    Left medial knee pain    -  Primary   NSAID, ice, if not improving, then refer for PT   Relevant Orders   DG Knee Complete 4 Views Left (Completed)       Follow up plan: No Follow-up on file.  An after-visit summary was printed and given to the patient at check-out.  Please see the patient instructions which may contain other information and recommendations beyond what is mentioned above in the assessment and plan.  Meds ordered this encounter  Medications  . celecoxib (CELEBREX) 200 MG capsule    Sig: Take 1 capsule (200 mg total) by mouth daily as needed.    Dispense:  30 capsule    Refill:  1    Acid reflux    Orders Placed This Encounter  Procedures  . DG Knee Complete 4 Views Left    Face-to-face time with patient was more than 25 minutes, >50% time spent counseling and coordination of care

## 2016-11-26 NOTE — Assessment & Plan Note (Signed)
encourged weight loss

## 2016-11-26 NOTE — Patient Instructions (Addendum)
I do encourage you to quit smoking Call [phone] to sign up for smoking cessation classes You can call 1-800-QUIT-NOW to talk with a smoking cessation coach  Check out the information at familydoctor.org entitled "Nutrition for Weight Loss: What You Need to Know about Fad Diets" Try to lose between 1-2 pounds per week by taking in fewer calories and burning off more calories You can succeed by limiting portions, limiting foods dense in calories and fat, becoming more active, and drinking 8 glasses of water a day (64 ounces) Don't skip meals, especially breakfast, as skipping meals may alter your metabolism Do not use over-the-counter weight loss pills or gimmicks that claim rapid weight loss A healthy BMI (or body mass index) is between 18.5 and 24.9 You can calculate your ideal BMI at the NIH website JobEconomics.huhttp://www.nhlbi.nih.gov/health/educational/lose_wt/BMI/bmicalc.htm  Try either L-lysine or B complex stress tabs daily to help prevent break-outs Condom use every time; be aware of risk of spread through oral sex  Knee Pain, Adult Many things can cause knee pain. The pain often goes away on its own with time and rest. If the pain does not go away, tests may be done to find out what is causing the pain. Follow these instructions at home: Activity  Rest your knee.  Do not do things that cause pain.  Avoid activities where both feet leave the ground at the same time (high-impact activities). Examples are running, jumping rope, and doing jumping jacks. General instructions  Take medicines only as told by your doctor.  Raise (elevate) your knee when you are resting. Make sure your knee is higher than your heart.  Sleep with a pillow under your knee.  If told, put ice on the knee: ? Put ice in a plastic bag. ? Place a towel between your skin and the bag. ? Leave the ice on for 20 minutes, 2-3 times a day.  Ask your doctor if you should wear an elastic knee support.  Lose weight if  you are overweight. Being overweight can make your knee hurt more.  Do not use any tobacco products. These include cigarettes, chewing tobacco, or electronic cigarettes. If you need help quitting, ask your doctor. Smoking may slow down healing. Contact a doctor if:  The pain does not stop.  The pain changes or gets worse.  You have a fever along with knee pain.  Your knee gives out or locks up.  Your knee swells, and becomes worse. Get help right away if:  Your knee feels warm.  You cannot move your knee.  You have very bad knee pain.  You have chest pain.  You have trouble breathing. Summary  Many things can cause knee pain. The pain often goes away on its own with time and rest.  Avoid activities that put stress on your knee. These include running and jumping rope.  Get help right away if you cannot move your knee, or if your knee feels warm, or if you have trouble breathing. This information is not intended to replace advice given to you by your health care provider. Make sure you discuss any questions you have with your health care provider. Document Released: 08/06/2008 Document Revised: 05/04/2016 Document Reviewed: 05/04/2016 Elsevier Interactive Patient Education  2017 [employer]

## 2016-12-02 DIAGNOSIS — K219 Gastro-esophageal reflux disease without esophagitis: Secondary | ICD-10-CM | POA: Insufficient documentation

## 2016-12-02 DIAGNOSIS — B009 Herpesviral infection, unspecified: Secondary | ICD-10-CM | POA: Insufficient documentation

## 2016-12-02 NOTE — Assessment & Plan Note (Signed)
Because of acid reflux hx, will use celebrex as NSAID for knee

## 2016-12-02 NOTE — Assessment & Plan Note (Signed)
Discussed mode of transmission, ways to decrease spread, risk of spread even with precautions; l-lysine or b complex stress tabs suggested; questions answered

## 2016-12-02 NOTE — Assessment & Plan Note (Signed)
Discussed s/s; will treat with NSAID, ice

## 2017-06-27 ENCOUNTER — Other Ambulatory Visit
Admission: RE | Admit: 2017-06-27 | Discharge: 2017-06-27 | Disposition: A | Discharge status: Home / Self Care | Payer: Managed Care, Other (non HMO) | Attending: Family Medicine | Admitting: Family Medicine

## 2017-07-09 NOTE — Progress Notes (Signed)
Closing out scanned document note open since  2017 

## 2017-08-19 ENCOUNTER — Other Ambulatory Visit
Admit: 2017-08-19 | Discharge: 2017-08-19 | Disposition: A | Discharge status: Home / Self Care | Payer: Worker's Compensation | Source: Other Acute Inpatient Hospital | Attending: Family Medicine | Admitting: Family Medicine

## 2017-08-19 NOTE — ED Notes (Signed)
Patient ambulatory to triage with steady gait, without difficulty or distress noted; pt st sent over for random drug screening by employer [employer]; denies c/o or need to see ED provider; workers comp profile indicates UDS required; EDT Sarah to triage to complete profile using chain of custody

## 2017-10-13 IMAGING — CR DG KNEE COMPLETE 4+V*L*
1 series · 4 of 4 positions shown · non-contrast
Comparison: None.

CLINICAL DATA: Left medial knee pain for several weeks

EXAM:
LEFT KNEE - COMPLETE 4+ VIEW

[Series 1: dg knee complete 4 views left · 0.14mm/px · 4 of 4 slices shown]
[im 1/4]
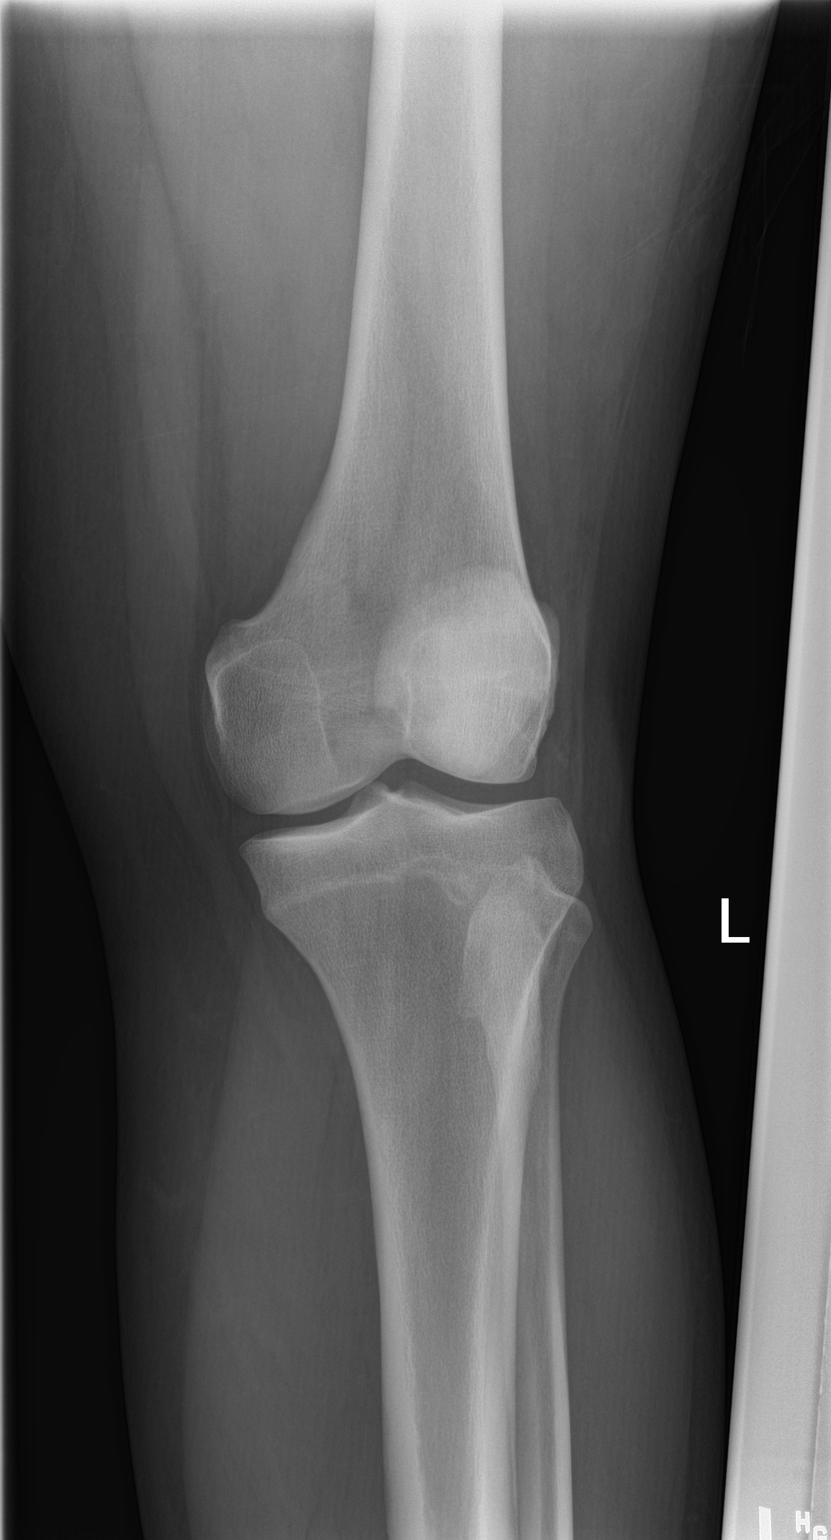
[im 2/4]
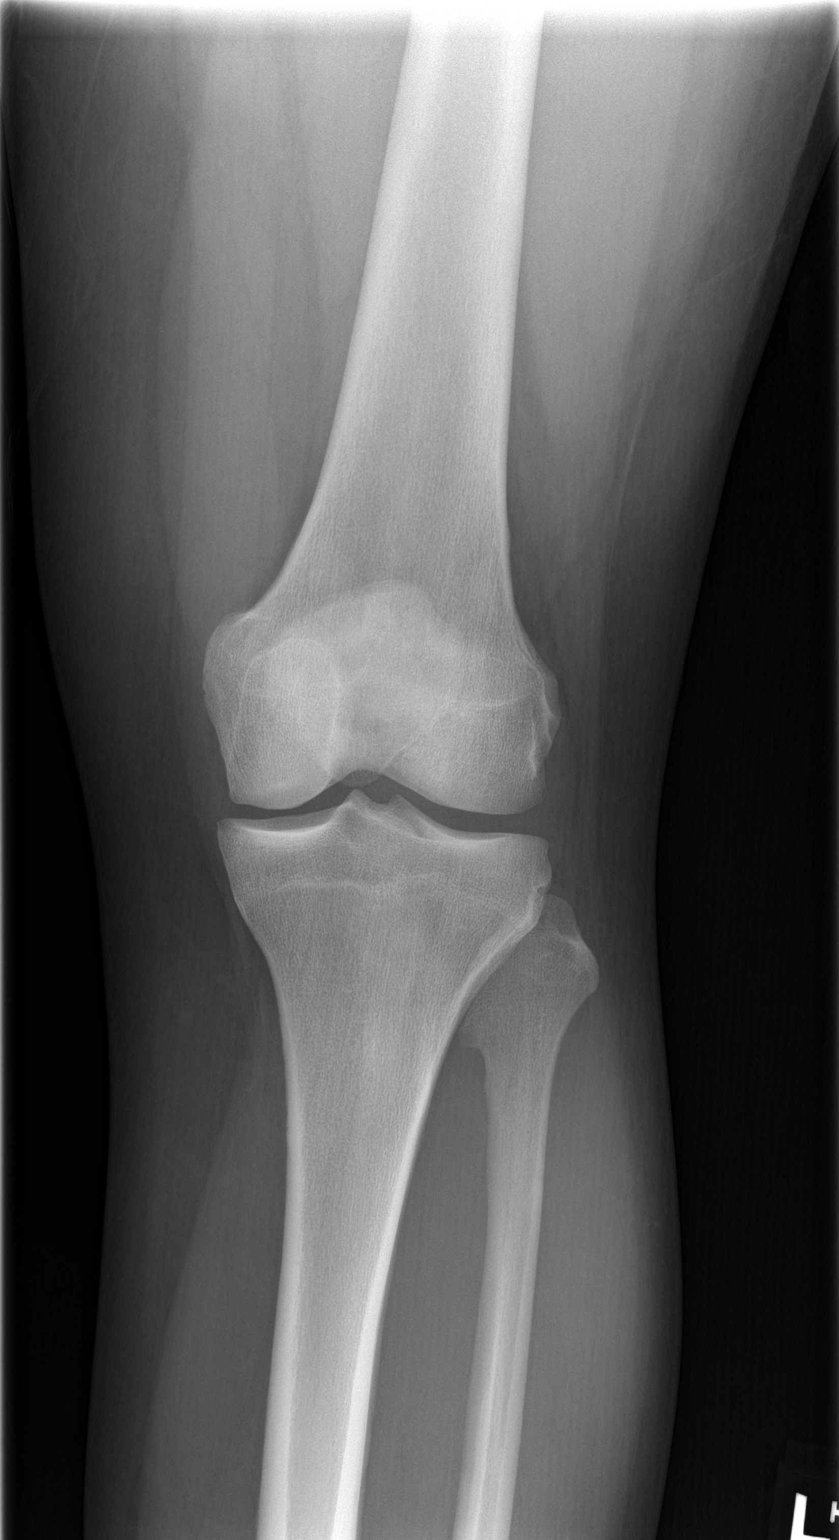
[im 3/4]
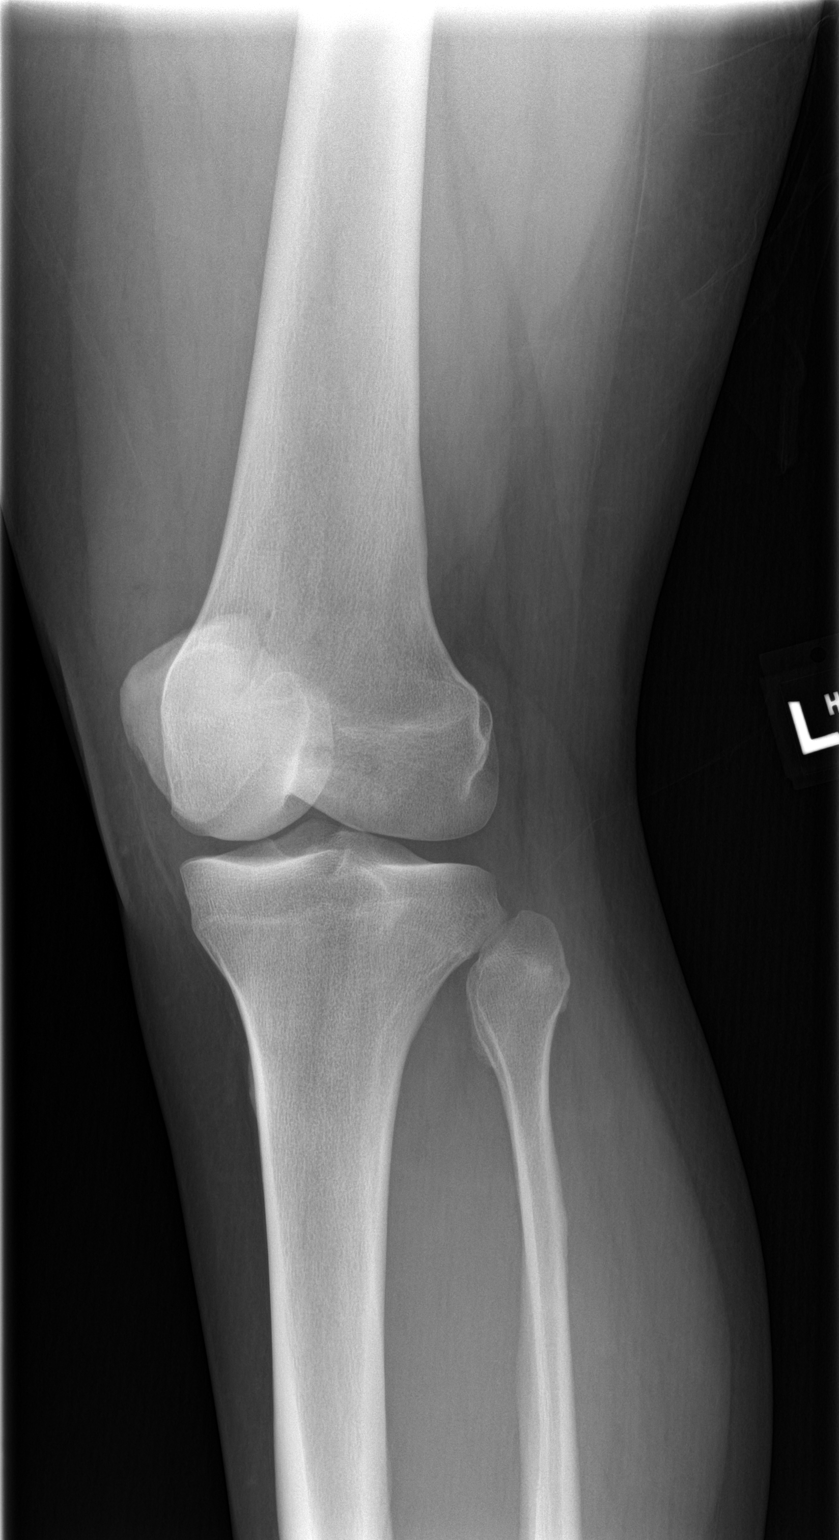
[im 4/4]
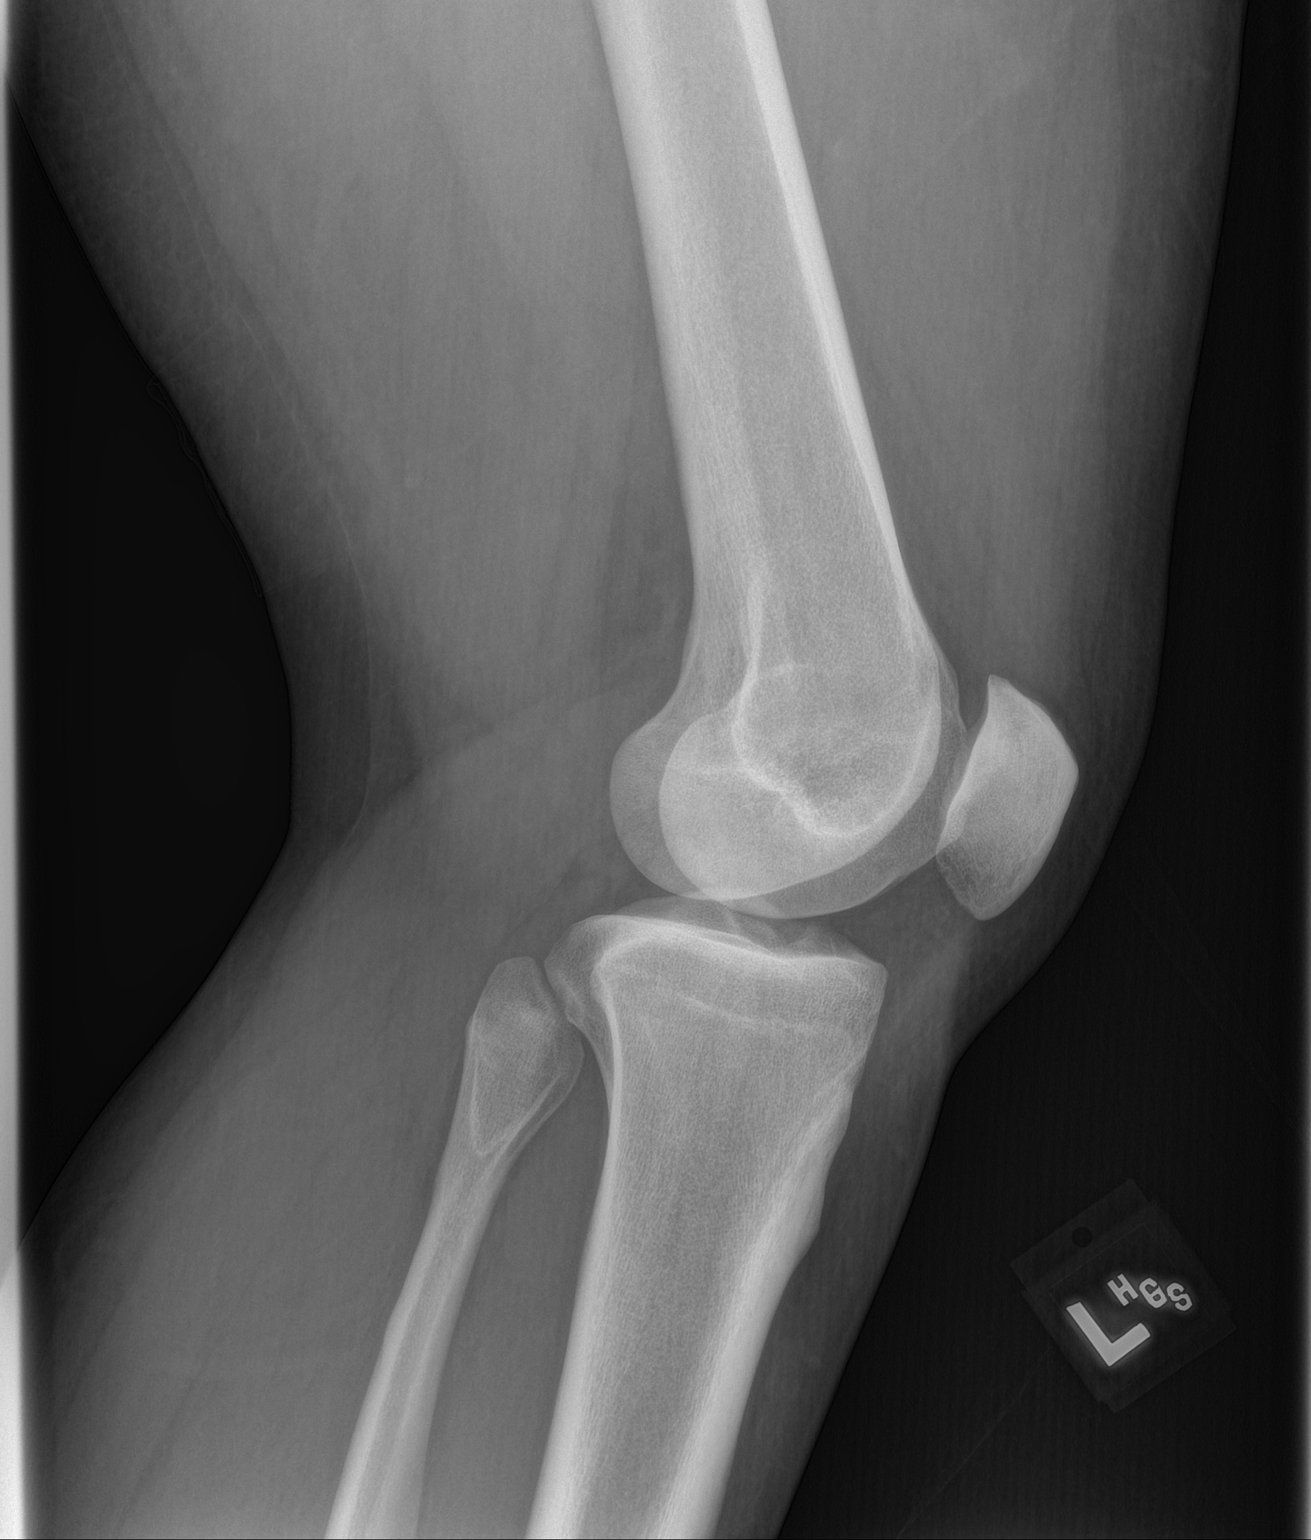

[4 of 4 positions shown; findings below may reference images not displayed]

FINDINGS: No evidence of fracture, dislocation, or joint effusion. No evidence
of arthropathy or other focal bone abnormality. Soft tissues are
unremarkable.
IMPRESSION: No acute abnormality noted.

## 2018-04-11 ENCOUNTER — Ambulatory Visit: Payer: Commercial Managed Care - PPO | Admitting: Family Medicine

## 2018-04-13 ENCOUNTER — Encounter: Payer: Self-pay | Admitting: Family Medicine

## 2018-04-13 ENCOUNTER — Ambulatory Visit (INDEPENDENT_AMBULATORY_CARE_PROVIDER_SITE_OTHER): Payer: Commercial Managed Care - PPO | Admitting: Family Medicine

## 2018-04-13 VITALS — BP 118/84 | HR 74 | Temp 98.3°F | Resp 18 | Ht 69.0 in | Wt 274.7 lb

## 2018-04-13 DIAGNOSIS — Z720 Tobacco use: Secondary | ICD-10-CM

## 2018-04-13 DIAGNOSIS — M5442 Lumbago with sciatica, left side: Secondary | ICD-10-CM

## 2018-04-13 DIAGNOSIS — M79605 Pain in left leg: Secondary | ICD-10-CM | POA: Diagnosis not present

## 2018-04-13 MED ORDER — CELECOXIB 200 MG PO CAPS: 200.0000 mg | ORAL_CAPSULE | Freq: Every day | ORAL | 0 refills | Status: DC | PRN

## 2018-04-13 NOTE — Progress Notes (Signed)
Name: Peter Jones   MRN: [id number]    DOB: 1986/03/14   Date:04/13/2018       Progress Note  Subjective  Chief Complaint  Chief Complaint  Patient presents with  . Leg Pain    left leg after jumping at trampoline park  . Dental Problem    white streaks [facility] gums[facility]     HPI  PT presents with concern for the following:  LLE and Left lower back pain - he went to a trampoline park a couple of weeks ago and rolled his left ankle - this has been improving, but his LEFT lower back and left upper leg are now having pain from walking with antalgic gain for a couple of weeks.  Denies weakness, numbness, tingling, limb swelling, bruising, no deformity. Discussed option for prednisone, he declines today, we will trial Celebrex as he has done well on this for joint pain in the past.  He is a smoker - 1ppd x16 years. Saw [facility] and was told he had "white streaks" on his oral mucosa in the inside of his cheeks that they recommended biopsy in February 2020 - has to wait until then due to cost.  He is getting a lot of things done right now for his health and his family - does not want to quit smoking right now.  Patient Active Problem List   Diagnosis Date Noted  . Left leg pain 04/13/2018  . Herpes 12/02/2016  . Acid reflux 12/02/2016  . Dairy product intolerance 06/17/2016  . Encounter for screening for HIV 06/17/2016  . Hx of atrial fibrillation without current medication 03/31/2016  . LVH (left ventricular hypertrophy) 03/31/2016  . Screen for STD (sexually transmitted disease) 03/16/2016  . Obesity 03/16/2016  . Low back pain 03/16/2016  . Patellar tendonitis of left knee 03/16/2016  . Obstructive sleep apnea on CPAP 04/15/2015  . Tobacco abuse 04/15/2015  . Hearing loss in right ear 04/15/2015    Social History   Tobacco Use  . Smoking status: Current Every Day Smoker    Packs/day: 1.00    Years: 13.00    Pack years: 13.00    Types: Cigarettes  .  Smokeless tobacco: Never Used  Substance Use Topics  . Alcohol use: No     Current Outpatient Medications:  .  celecoxib (CELEBREX) 200 MG capsule, Take 1 capsule (200 mg total) by mouth daily as needed., Disp: 30 capsule, Rfl: 0 .  RaNITidine HCl (ZANTAC PO), Take by mouth as needed. , Disp: , Rfl:   No Known Allergies  I personally reviewed active problem list, medication list, allergies, notes from last encounter, lab results with the patient/caregiver today.  ROS  Ten systems reviewed and is negative except as mentioned in HPI.  Objective  Vitals:   04/13/18 0859  BP: 118/84  Pulse: 74  Resp: 18  Temp: 98.3 F (36.8 C)  TempSrc: Oral  SpO2: 99%  Weight: 274 lb 11.2 oz (124.6 kg)  Height: 5\' 9"  (1.753 m)   Body mass index is 40.57 kg/m.  Nursing Note and Vital Signs reviewed.  Physical Exam  Constitutional: Patient appears well-developed and well-nourished. No distress.  HENT: Head: Normocephalic and atraumatic. Ears: bilateral TMs with no erythema or effusion; Nose: Nose normal. Mouth/Throat: Oropharynx is clear and moist. No oropharyngeal exudate or tonsillar swelling.  Eyes: Conjunctivae and EOM are normal. No scleral icterus.  Pupils are equal, round, and reactive to light.  Neck: Normal  range of motion. Neck supple. No JVD present. No thyromegaly present.  Cardiovascular: Normal rate, regular rhythm and normal heart sounds.  No murmur heard. No BLE edema. Pulmonary/Chest: Effort normal and breath sounds normal. No respiratory distress. Musculoskeletal: Normal range of motion, no joint effusions. No gross deformities, no laxity, no crepitus.  LEFT lower back does have mild tenderness. No ecchymotic areas.  Neurological: Pt is alert and oriented to person, place, and time. No cranial nerve deficit. Coordination, balance, strength, speech and gait are normal.  Skin: Skin is warm and dry. No rash noted. No erythema.  Psychiatric: Patient has a normal mood and  affect. behavior is normal. Judgment and thought content normal.  No results found for this or any previous visit (from the past 72 hour(s)).  Assessment & Plan  Problem List Items Addressed This Visit      Other   Tobacco abuse    He is not ready to quit - will consider Chantix, Wellbutrin, or patches in the future      Low back pain - Primary    Gentle stretching, discussed prednisone, declines today - we will trial NSAID first, switch to prednisone if not effective.      Relevant Medications   celecoxib (CELEBREX) 200 MG capsule   Left leg pain      -Red flags and when to present for emergency care or RTC including fever >101.71F, chest pain, shortness of breath, new/worsening/un-resolving symptoms, LLE weakness/numbness/tingling, reviewed with patient at time of visit. Follow up and care instructions discussed and provided in AVS.

## 2018-04-13 NOTE — Assessment & Plan Note (Signed)
He is not ready to quit - will consider Chantix, Wellbutrin, or patches in the future

## 2018-04-13 NOTE — Assessment & Plan Note (Signed)
Gentle stretching, discussed prednisone, declines today - we will trial NSAID first, switch to prednisone if not effective.

## 2018-04-14 ENCOUNTER — Ambulatory Visit: Payer: Self-pay

## 2018-04-14 NOTE — Telephone Encounter (Signed)
Call returned to [place] at [facility].  Answered question of indication for Celebrex.  Reported to [place] that the pt. was eval. in office for low back pain; per progress note of Maurice Smallmily Boyce Celebrex 200 mg qd, prn was ordered.  Advised the note on RX of  "Acid Reflux"  was written in error, and to disregard.  Verb. understanding.  Stated the medication will be dispensed for Low back pain.      Reason for Disposition . Health Information question, no triage required and triager able to answer question  Answer Assessment - Initial Assessment Questions 1. REASON FOR CALL or QUESTION: "What is your reason for calling today?" or "How can I best help you?" or "What question do you have that I can help answer?"     Calling to clarify the Celebrex indication  Protocols used: INFORMATION ONLY CALL-A-AH  Message from Elliot Gaultiffany M Bell sent at 04/14/2018 8:08 AM EST   Summary: celecoxib (CELEBREX) 200 MG capsule clarity    Caller name: Lesile  Relation to pt:  Call back number: [phone]  Pharmacy: [id number] - [place], [place] - [address] [facility] [address] [phone] (Phone) [phone] (Fax)    Reason for call:  Patient was seen by Doren CustardBoyce, Emily E, FNP at [facility] yesterday and Pharmacist seeking clarity regarding the following medication celecoxib (CELEBREX) 200 MG capsule. As per pharmacist medication is not used for acid reflux Take 1 capsule (200 mg total) by mouth daily as needed usually medication is prescribed for pain, please advise

## 2018-04-17 ENCOUNTER — Other Ambulatory Visit: Payer: Self-pay | Admitting: Family Medicine

## 2018-04-17 ENCOUNTER — Ambulatory Visit: Payer: Self-pay

## 2018-04-17 DIAGNOSIS — M5442 Lumbago with sciatica, left side: Secondary | ICD-10-CM

## 2018-04-17 NOTE — Telephone Encounter (Signed)
Attempted to call pt.; left voice message that nurse will update Peter Jones of his symptom of tingling left lateral calf, and encouraged to call back if he has any other symptoms to report to Triage nurse.         Message from [name] sent at 04/17/2018 7:54 AM EST   Summary: advice   Pt works 3rd shift so may not answer, but states ok to leave message. ----- Message from [name] sent at 04/17/2018 7:53 AM EST ----- Pt saw Peter SmallEmily Jones on 11/21. She asked him if he had tingling and he said no, but now is experiencing tingling below the knee, mostly outside calf on left leg. Not sure if this means anything, but wanted it noted and see what she thinks. Also pt has not noticed any change since started the celebrex, but it has only been a few days

## 2018-04-17 NOTE — Telephone Encounter (Signed)
Attempted contact pt regarding symptoms; left message on voicemail [phone]; unable to complete nurse triage at this time; will route to office for final disposition; pt seen in office 04/13/18.

## 2018-04-17 NOTE — Telephone Encounter (Signed)
Pt returning call to the office. Pt states he was seen in the office on 11/21 with Idolina PrimerEmily Boyce,NP for leg pain he has experienced off and on for a couple of months. Pt states at the time of the appt he was asked if he was having tingling in his leg and he initially said no because he could not remember. Pt states that he now recalls having tingling below the knee of the left leg and on top of his foot. Pt also reports that he has not noticed any change in is symptoms since starting Celebrex. Pt wanted to make Emily,NP aware of this information and did not know if this would change the original diagnosis.

## 2018-04-18 MED ORDER — PREDNISONE 5 MG (48) PO TBPK: ORAL_TABLET | ORAL | 0 refills | Status: DC

## 2018-04-18 NOTE — Telephone Encounter (Signed)
Please let him know that he should stop the Celebrex. I have sent in Prednisone like we discussed in t he office. The numbness and tingling may be related to inflammation/sciatic nerve pain.  Let's trial this medication - take as directed, and if not improving, come back in for re-evaluation. If he develops increased thirst or urinary frequency he needs to let us know as this can indicated increased blood sugar levels which can sometimes happen with prednisone.

## 2018-04-18 NOTE — Telephone Encounter (Signed)
Left message on 264# and 350#

## 2018-04-18 NOTE — Addendum Note (Signed)
Addended by: Doren CustardBOYCE, EMILY E on: 04/18/2018 10:39 AM   Modules accepted: Orders

## 2018-04-19 ENCOUNTER — Telehealth: Payer: Self-pay

## 2018-04-19 DIAGNOSIS — M5442 Lumbago with sciatica, left side: Secondary | ICD-10-CM

## 2018-04-19 MED ORDER — PREDNISONE 5 MG (48) PO TBPK: ORAL_TABLET | ORAL | 0 refills | Status: DC

## 2018-04-19 NOTE — Telephone Encounter (Signed)
Copied from CRM [id number]. Topic: General - Other >> Apr 19, 2018 12:59 PM Gerrianne ScalePayne, Angela L wrote: Reason for CRM: pt calling stating that Maurice Smallmily Boyce was going to switch him to something else and for him to stop taking the celebrex  and start prednisone the pharmacy states that RX isn't there >> Apr 19, 2018  1:16 PM Raoul PitchWilliams-Neal, Sade R wrote:  Patient called in and stated [facility] are saying they didn't receive order for predniSONE 5 MG (48)   >> Apr 19, 2018  2:13 PM Limmie PatriciaGraham, Cassandra P wrote: Elmarie Shileyiffany will you check into this, Irving Burtonmily is not here and will not return until Tuesday

## 2018-04-19 NOTE — Telephone Encounter (Signed)
Sent prednisone 5 mg #48

## 2018-04-26 ENCOUNTER — Other Ambulatory Visit: Payer: Self-pay | Admitting: Family Medicine

## 2018-04-26 DIAGNOSIS — M5442 Lumbago with sciatica, left side: Secondary | ICD-10-CM

## 2018-04-26 MED ORDER — PREDNISONE 5 MG (48) PO TBPK: ORAL_TABLET | ORAL | 0 refills | Status: DC

## 2018-04-26 NOTE — Progress Notes (Signed)
Spoke with patient today, states pharmacy never received prednisone - resent today.

## 2018-04-28 ENCOUNTER — Telehealth: Payer: Self-pay

## 2018-04-28 NOTE — Telephone Encounter (Signed)
I did not see the patient or order this medicine; forwarding to correct provider

## 2018-04-28 NOTE — Telephone Encounter (Signed)
Myriam JacobsonHelen - please call the pharmacy. I have sent this in twice now.

## 2018-04-28 NOTE — Telephone Encounter (Signed)
Spoke with [facility] and they never received it. Called in a verbal prescription of the Prednisone to the pharmacist since it was not going thru electronically.

## 2018-04-28 NOTE — Telephone Encounter (Signed)
Copied from CRM [id number]. Topic: General - Other >> Apr 27, 2018  4:39 PM Mcneil, Ja-Kwan wrote: Reason for CRM: Pt stated that he was advised by his pharmacy that the Rx for predniSONE (STERAPRED UNI-PAK 48 TAB) 5 MG (48) TBPK tablet still has not be received. Pt requests that the Rx be sent to [id number] - [place], [place] - [address] AT [facility] [address] [facility] [facility]

## 2018-07-10 ENCOUNTER — Other Ambulatory Visit
Admission: RE | Admit: 2018-07-10 | Discharge: 2018-07-10 | Disposition: A | Discharge status: Home / Self Care | Payer: Worker's Compensation | Attending: Family Medicine | Admitting: Family Medicine

## 2018-07-10 NOTE — ED Notes (Addendum)
Pt. With employer request for uds and breath analysis.  Consents obtained.  Breath analysis done; test no 802.  Urine collected and released to lab via chain of custody protocol; specimen no [id number].

## 2018-08-17 ENCOUNTER — Other Ambulatory Visit: Payer: Self-pay

## 2018-08-17 ENCOUNTER — Ambulatory Visit: Payer: Commercial Managed Care - PPO | Admitting: Family Medicine

## 2018-08-17 ENCOUNTER — Encounter: Payer: Self-pay | Admitting: Family Medicine

## 2018-08-17 VITALS — BP 128/72 | HR 101 | Temp 99.7°F | Resp 18 | Ht 69.0 in | Wt 266.0 lb

## 2018-08-17 DIAGNOSIS — J989 Respiratory disorder, unspecified: Secondary | ICD-10-CM | POA: Diagnosis not present

## 2018-08-17 MED ORDER — LEVOCETIRIZINE DIHYDROCHLORIDE 5 MG PO TABS: 5.0000 mg | ORAL_TABLET | Freq: Every evening | ORAL | 0 refills | Status: DC

## 2018-08-17 MED ORDER — GUAIFENESIN ER 600 MG PO TB12
600.0000 mg | ORAL_TABLET | Freq: Two times a day (BID) | ORAL | 0 refills | Status: DC
Start: 2018-08-17 — End: 2019-06-05

## 2018-08-17 MED ORDER — BENZONATATE 100 MG PO CAPS: 100.0000 mg | ORAL_CAPSULE | Freq: Three times a day (TID) | ORAL | 0 refills | Status: DC | PRN

## 2018-08-17 NOTE — Patient Instructions (Addendum)
Person Under Monitoring Name: Peter Jones  Location: [address] [place] [address]   Infection Prevention Recommendations for Individuals Confirmed to have, or Being Evaluated for, 2019 Novel Coronavirus (COVID-19) Infection Who Receive Care at Home  Individuals who are confirmed to have, or are being evaluated for, COVID-19 should follow the prevention steps below until a healthcare provider or local or state health department says they can return to normal activities.  Stay home except to get medical care You should restrict activities outside your home, except for getting medical care. Do not go to work, school, or public areas, and do not use public transportation or taxis.  Call ahead before visiting your doctor Before your medical appointment, call the healthcare provider and tell them that you have, or are being evaluated for, COVID-19 infection. This will help the healthcare provider's office take steps to keep other people from getting infected. Ask your healthcare provider to call the local or state health department.  Monitor your symptoms Seek prompt medical attention if your illness is worsening (e.g., difficulty breathing). Before going to your medical appointment, call the healthcare provider and tell them that you have, or are being evaluated for, COVID-19 infection. Ask your healthcare provider to call the local or state health department.  Wear a facemask You should wear a facemask that covers your nose and mouth when you are in the same room with other people and when you visit a healthcare provider. People who live with or visit you should also wear a facemask while they are in the same room with you.  Separate yourself from other people in your home As much as possible, you should stay in a different room from other people in your home. Also, you should use a separate bathroom, if available.  Avoid sharing household items You should not share dishes,  drinking glasses, cups, eating utensils, towels, bedding, or other items with other people in your home. After using these items, you should wash them thoroughly with soap and water.  Cover your coughs and sneezes Cover your mouth and nose with a tissue when you cough or sneeze, or you can cough or sneeze into your sleeve. Throw used tissues in a lined trash can, and immediately wash your hands with soap and water for at least 20 seconds or use an alcohol-based hand rub.  Wash your [employer] your hands often and thoroughly with soap and water for at least 20 seconds. You can use an alcohol-based hand sanitizer if soap and water are not available and if your hands are not visibly dirty. Avoid touching your eyes, nose, and mouth with unwashed hands.  Prevention Steps for Caregivers and Household Members of Individuals Confirmed to have, or Being Evaluated for, COVID-19 Infection Being Cared for in the Home  If you live with, or provide care at home for, a person confirmed to have, or being evaluated for, COVID-19 infection please follow these guidelines to prevent infection:  Follow healthcare provider's instructions Make sure that you understand and can help the patient follow any healthcare provider instructions for all care.  Provide for the patient's basic needs You should help the patient with basic needs in the home and provide support for getting groceries, prescriptions, and other personal needs.  Monitor the patient's symptoms If they are getting sicker, call his or her medical provider and tell them that the patient has, or is being evaluated for, COVID-19 infection. This will help the healthcare provider's office take  steps to keep other people from getting infected. Ask the healthcare provider to call the local or state health department.  Limit the number of people who have contact with the patient  If possible, have only one caregiver for the patient.  Other household  members should stay in another home or place of residence. If this is not possible, they should stay  in another room, or be separated from the patient as much as possible. Use a separate bathroom, if available.  Restrict visitors who do not have an essential need to be in the home.  Keep older adults, very young children, and other sick people away from the patient Keep older adults, very young children, and those who have compromised immune systems or chronic health conditions away from the patient. This includes people with chronic heart, lung, or kidney conditions, diabetes, and cancer.  Ensure good ventilation Make sure that shared spaces in the home have good air flow, such as from an air conditioner or an opened window, weather permitting.  Wash your hands often  Wash your hands often and thoroughly with soap and water for at least 20 seconds. You can use an alcohol based hand sanitizer if soap and water are not available and if your hands are not visibly dirty.  Avoid touching your eyes, nose, and mouth with unwashed hands.  Use disposable paper towels to dry your hands. If not available, use dedicated cloth towels and replace them when they become wet.  Wear a facemask and gloves  Wear a disposable facemask at all times in the room and gloves when you touch or have contact with the patient's blood, body fluids, and/or secretions or excretions, such as sweat, saliva, sputum, nasal mucus, vomit, urine, or feces.  Ensure the mask fits over your nose and mouth tightly, and do not touch it during use.  Throw out disposable facemasks and gloves after using them. Do not reuse.  Wash your hands immediately after removing your facemask and gloves.  If your personal clothing becomes contaminated, carefully remove clothing and launder. Wash your hands after handling contaminated clothing.  Place all used disposable facemasks, gloves, and other waste in a lined container before disposing  them with other household waste.  Remove gloves and wash your hands immediately after handling these items.  Do not share dishes, glasses, or other household items with the patient  Avoid sharing household items. You should not share dishes, drinking glasses, cups, eating utensils, towels, bedding, or other items with a patient who is confirmed to have, or being evaluated for, COVID-19 infection.  After the person uses these items, you should wash them thoroughly with soap and water.  Wash laundry thoroughly  Immediately remove and wash clothes or bedding that have blood, body fluids, and/or secretions or excretions, such as sweat, saliva, sputum, nasal mucus, vomit, urine, or feces, on them.  Wear gloves when handling laundry from the patient.  Read and follow directions on labels of laundry or clothing items and detergent. In general, wash and dry with the warmest temperatures recommended on the label.  Clean all areas the individual has used often  Clean all touchable surfaces, such as counters, tabletops, doorknobs, bathroom fixtures, toilets, phones, keyboards, tablets, and bedside tables, every day. Also, clean any surfaces that may have blood, body fluids, and/or secretions or excretions on them.  Wear gloves when cleaning surfaces the patient has come in contact with.  Use a diluted bleach solution (e.g., dilute bleach with 1 part bleach  and 10 parts water) or a household disinfectant with a label that says EPA-registered for coronaviruses. To make a bleach solution at home, add 1 tablespoon of bleach to 1 quart (4 cups) of water. For a larger supply, add  cup of bleach to 1 gallon (16 cups) of water.  Read labels of cleaning products and follow recommendations provided on product labels. Labels contain instructions for safe and effective use of the cleaning product including precautions you should take when applying the product, such as wearing gloves or eye protection and making  sure you have good ventilation during use of the product.  Remove gloves and wash hands immediately after cleaning.  Monitor yourself for signs and symptoms of illness Caregivers and household members are considered close contacts, should monitor their health, and will be asked to limit movement outside of the home to the extent possible. Follow the monitoring steps for close contacts listed on the symptom monitoring form.  ? If you have additional questions, contact your local health department or call the epidemiologist on call at [phone] (available 24/7). ? This guidance is subject to change. For the most up-to-date guidance from [facility], please refer to their website: [email or web address]hu

## 2018-08-17 NOTE — Progress Notes (Signed)
Name: Peter Jones   MRN: [id number]    DOB: [birth year]   Date:08/17/2018       Progress Note  Subjective  Chief Complaint  Chief Complaint  Patient presents with  . URI    cough, congested for 2 days    HPI  Pt presents with 1-2 day history of congested cough, nasal congestion, low-grade fever, fatigue, mild headache, 1-2 episodes of diarrhea.  He has not had any travel in the last 14 days, his job closed down 2 days prior. No known contact with any +COVID-19 patients.  His daughter did have a cold last week, otherwise no known sick contacts.  Denies body aches, chest discomfort, shortness of breath.    Patient Active Problem List   Diagnosis Date Noted  . Left leg pain 04/13/2018  . Herpes 12/02/2016  . Acid reflux 12/02/2016  . Dairy product intolerance 06/17/2016  . Encounter for screening for HIV 06/17/2016  . Hx of atrial fibrillation without current medication 03/31/2016  . LVH (left ventricular hypertrophy) 03/31/2016  . Screen for STD (sexually transmitted disease) 03/16/2016  . Obesity 03/16/2016  . Low back pain 03/16/2016  . Patellar tendonitis of left knee 03/16/2016  . Obstructive sleep apnea on CPAP 04/15/2015  . Tobacco abuse 04/15/2015  . Hearing loss in right ear 04/15/2015    Social History   Tobacco Use  . Smoking status: Current Every Day Smoker    Packs/day: 1.00    Years: 13.00    Pack years: 13.00    Types: Cigarettes  . Smokeless tobacco: Never Used  Substance Use Topics  . Alcohol use: No     Current Outpatient Medications:  .  celecoxib (CELEBREX) 100 MG capsule, Take by mouth., Disp: , Rfl:  .  predniSONE (STERAPRED UNI-PAK 48 TAB) 5 MG (48) TBPK tablet, Take as directed with food, stop celebrex. (Patient not taking: Reported on 08/17/2018), Disp: 48 tablet, Rfl: 0 .  RaNITidine HCl (ZANTAC PO), Take by mouth as needed. , Disp: , Rfl:   No Known Allergies  I personally reviewed active problem list, medication list, allergies, notes  from last encounter, lab results with the patient/caregiver today.  ROS  Ten systems reviewed and is negative except as mentioned in HPI.  Objective  Vitals:   08/17/18 1233  BP: 128/72  Pulse: (!) 101  Resp: 18  Temp: 99.7 F (37.6 C)  TempSrc: Oral  SpO2: 96%  Weight: 266 lb (120.7 kg)  Height: 5\' 9"  (1.753 m)   Body mass index is 39.28 kg/m.  Nursing Note and Vital Signs reviewed.  Physical Exam  Constitutional: Patient appears well-developed and well-nourished. Obese. No distress.  HEENT: head atraumatic, normocephalic, pupils equal and reactive to light, Bilateral TM's without erythema or effusion,  bilateral maxillary and frontal sinuses are non-tender, neck supple without lymphadenopathy, throat within normal limits - no erythema or exudate, no tonsillar swelling Cardiovascular: Normal rate, regular rhythm and normal heart sounds.  No murmur heard. No BLE edema. Pulmonary/Chest: Effort normal and breath sounds clear bilaterally. No respiratory distress. Abdominal: Soft, bowel sounds normal, there is no tenderness, no HSM Psychiatric: Patient has a normal mood and affect. behavior is normal. Judgment and thought content normal.  No results found for this or any previous visit (from the past 72 hour(s)).  Assessment & Plan  1. Respiratory illness - You likely have a viral respiratory infection. Cover your nose/mouth when you cough or sneeze and wash your hands well and often. Here  are some helpful things you can use or pick up over the counter from the pharmacy to help with your symptoms:  For Fever/Pain: Acetaminophen every 6 hours as needed (maximum of 3000mg  a day). If you are still uncomfortable you can add ibuprofen OR naproxen  For coughing: try dextromethorphan for a cough suppressant, and/or a cool mist humidifier, lozenges  For sore throat: saline gargles, honey herbal tea, lozenges, throat spray  To dry out your nose: try an antihistamine like loratadine  (non-sedating) or diphenhydramine (sedating) or others To relieve a stuffy nose: try an oral decongestant  Like pseudoephedrine if you are under the age of 62 and do not have high blood pressure, neti pot To make blowing your nose easier: guaifenesin   - guaiFENesin (MUCINEX) 600 MG 12 hr tablet; Take 1 tablet (600 mg total) by mouth 2 (two) times daily.  Dispense: 20 tablet; Refill: 0 - levocetirizine (XYZAL) 5 MG tablet; Take 1 tablet (5 mg total) by mouth every evening.  Dispense: 10 tablet; Refill: 0  -Red flags and when to present for emergency care or RTC including fever >101.58F, chest pain, shortness of breath, new/worsening/un-resolving symptoms, reviewed with patient at time of visit. Follow up and care instructions discussed and provided in AVS.

## 2018-08-17 NOTE — Addendum Note (Signed)
Addended by: Doren Custard on: 08/17/2018 01:10 PM   Modules accepted: Orders

## 2018-08-21 ENCOUNTER — Encounter: Payer: Self-pay | Admitting: Family Medicine

## 2018-12-15 ENCOUNTER — Other Ambulatory Visit: Payer: Self-pay

## 2018-12-15 ENCOUNTER — Ambulatory Visit: Payer: Commercial Managed Care - PPO | Admitting: Family Medicine

## 2018-12-15 ENCOUNTER — Other Ambulatory Visit (HOSPITAL_COMMUNITY)
Admission: RE | Admit: 2018-12-15 | Discharge: 2018-12-15 | Disposition: A | Discharge status: Home / Self Care | Payer: Commercial Managed Care - PPO | Source: Ambulatory Visit | Attending: Family Medicine | Admitting: Family Medicine

## 2018-12-15 ENCOUNTER — Encounter: Payer: Self-pay | Admitting: Family Medicine

## 2018-12-15 VITALS — BP 120/80 | HR 87 | Temp 97.7°F | Resp 18 | Ht 69.0 in | Wt 261.2 lb

## 2018-12-15 DIAGNOSIS — Z113 Encounter for screening for infections with a predominantly sexual mode of transmission: Secondary | ICD-10-CM | POA: Insufficient documentation

## 2018-12-15 NOTE — Progress Notes (Signed)
Name: Peter Jones   MRN: [id number]    DOB: February 10, 1986   Date:12/15/2018       Progress Note  Subjective  Chief Complaint  Chief Complaint  Patient presents with   Consult    discuss screening for STD    HPI  Pt presents with concern for STI screening.  He had recent break up with a male partner.  No new partners, but would like STI screening to ensure he has no infection going forward.  He denies penile discharge, bleeding or dysuria.  No abnormal fatigue, no rashes.  No known STI exposure.   Patient Active Problem List   Diagnosis Date Noted   Left leg pain 04/13/2018   Herpes 12/02/2016   Acid reflux 12/02/2016   Dairy product intolerance 06/17/2016   Encounter for screening for HIV 06/17/2016   Hx of atrial fibrillation without current medication 03/31/2016   LVH (left ventricular hypertrophy) 03/31/2016   Screen for STD (sexually transmitted disease) 03/16/2016   Obesity 03/16/2016   Low back pain 03/16/2016   Patellar tendonitis of left knee 03/16/2016   Obstructive sleep apnea on CPAP 04/15/2015   Tobacco abuse 04/15/2015   Hearing loss in right ear 04/15/2015    Social History   Tobacco Use   Smoking status: Current Every Day Smoker    Packs/day: 1.00    Years: 13.00    Pack years: 13.00    Types: Cigarettes   Smokeless tobacco: Never Used  Substance Use Topics   Alcohol use: No     Current Outpatient Medications:    benzonatate (TESSALON) 100 MG capsule, Take 1-2 capsules (100-200 mg total) by mouth 3 (three) times daily as needed. (Patient not taking: Reported on 12/15/2018), Disp: 30 capsule, Rfl: 0   celecoxib (CELEBREX) 100 MG capsule, Take by mouth., Disp: , Rfl:    guaiFENesin (MUCINEX) 600 MG 12 hr tablet, Take 1 tablet (600 mg total) by mouth 2 (two) times daily. (Patient not taking: Reported on 12/15/2018), Disp: 20 tablet, Rfl: 0   levocetirizine (XYZAL) 5 MG tablet, Take 1 tablet (5 mg total) by mouth every evening.  (Patient not taking: Reported on 12/15/2018), Disp: 10 tablet, Rfl: 0   RaNITidine HCl (ZANTAC PO), Take by mouth as needed. , Disp: , Rfl:   No Known Allergies  I personally reviewed active problem list, medication list, allergies, notes from last encounter, lab results with the patient/caregiver today.  ROS  Ten systems reviewed and is negative except as mentioned in HPI  Objective  Vitals:   12/15/18 1123  BP: 120/80  Pulse: 87  Resp: 18  Temp: 97.7 F (36.5 C)  TempSrc: Oral  SpO2: 96%  Weight: 261 lb 3.2 oz (118.5 kg)  Height: 5\' 9"  (1.753 m)   Body mass index is 38.57 kg/m.  Nursing Note and Vital Signs reviewed.  Physical Exam  Constitutional: Patient appears well-developed and well-nourished. No distress.  HENT: Head: Normocephalic and atraumatic.  Neck: Normal range of motion. Neck supple.  Cardiovascular: Normal rate, regular rhythm and normal heart sounds.  No murmur heard. No BLE edema. Pulmonary/Chest: Effort normal and breath sounds normal. No respiratory distress. Musculoskeletal: Normal range of motion, no joint effusions. No gross deformities Neurological: Pt is alert and oriented to person, place, and time. No cranial nerve deficit. Coordination, balance, strength, speech and gait are normal.  Skin: Skin is warm and dry. No rash noted. No erythema.  Psychiatric: Patient has a normal mood and affect. behavior is  normal. Judgment and thought content normal.  No results found for this or any previous visit (from the past 72 hour(s)).  Assessment & Plan  1. Routine screening for STI (sexually transmitted infection) - Urine cytology ancillary only - RPR - HIV Antibody (routine testing w rflx)  -Red flags and when to present for emergency care or RTC including fever >101.54F, chest pain, shortness of breath, new/worsening/un-resolving symptoms, reviewed with patient at time of visit. Follow up and care instructions discussed and provided in AVS.

## 2018-12-16 LAB — URINE CYTOLOGY ANCILLARY ONLY
Chlamydia: NEGATIVE
Neisseria Gonorrhea: NEGATIVE
Trichomonas: NEGATIVE

## 2018-12-18 ENCOUNTER — Other Ambulatory Visit: Payer: Self-pay | Admitting: Family Medicine

## 2018-12-19 LAB — RPR: RPR Ser Ql: NONREACTIVE

## 2018-12-19 LAB — HIV ANTIBODY (ROUTINE TESTING W REFLEX): HIV 1&2 Ab, 4th Generation: NONREACTIVE

## 2019-06-05 ENCOUNTER — Encounter: Payer: Self-pay | Admitting: Family Medicine

## 2019-06-05 ENCOUNTER — Ambulatory Visit: Payer: Commercial Managed Care - PPO | Admitting: Family Medicine

## 2019-06-05 ENCOUNTER — Other Ambulatory Visit: Payer: Self-pay

## 2019-06-05 ENCOUNTER — Other Ambulatory Visit (HOSPITAL_COMMUNITY)
Admission: RE | Admit: 2019-06-05 | Discharge: 2019-06-05 | Disposition: A | Discharge status: Home / Self Care | Payer: Commercial Managed Care - PPO | Source: Ambulatory Visit | Attending: Family Medicine | Admitting: Family Medicine

## 2019-06-05 VITALS — BP 126/86 | HR 91 | Temp 97.7°F | Resp 16 | Ht 69.0 in | Wt 275.3 lb

## 2019-06-05 DIAGNOSIS — Z13220 Encounter for screening for lipoid disorders: Secondary | ICD-10-CM | POA: Diagnosis not present

## 2019-06-05 DIAGNOSIS — Z113 Encounter for screening for infections with a predominantly sexual mode of transmission: Secondary | ICD-10-CM

## 2019-06-05 DIAGNOSIS — Z131 Encounter for screening for diabetes mellitus: Secondary | ICD-10-CM | POA: Diagnosis not present

## 2019-06-05 NOTE — Progress Notes (Signed)
Name: Peter Jones   MRN: [id number]    DOB: 09-Sep-1985   Date:06/05/2019       Progress Note  Subjective  Chief Complaint  Chief Complaint  Patient presents with  . Follow-up    lab work for STD testing    HPI  Pt presents for STI screening; had new male partner and would like STI screening.  He denies any symptoms - no penile discharge, no penile bleeding, no dysuria/frequent urination, or abdominal pain, no penile lesions.  He does note possible exposure to HSV from his ex-wife years ago; he also endorses history of cold sores - discussed utility of HSV blood work, he still wants to proceed.  We will obtain labs today.  Patient Active Problem List   Diagnosis Date Noted  . Left leg pain 04/13/2018  . Herpes 12/02/2016  . Acid reflux 12/02/2016  . Dairy product intolerance 06/17/2016  . Encounter for screening for HIV 06/17/2016  . Hx of atrial fibrillation without current medication 03/31/2016  . LVH (left ventricular hypertrophy) 03/31/2016  . Screen for STD (sexually transmitted disease) 03/16/2016  . Obesity 03/16/2016  . Low back pain 03/16/2016  . Patellar tendonitis of left knee 03/16/2016  . Obstructive sleep apnea on CPAP 04/15/2015  . Tobacco abuse 04/15/2015  . Hearing loss in right ear 04/15/2015    Social History   Tobacco Use  . Smoking status: Current Every Day Smoker    Packs/day: 1.00    Years: 13.00    Pack years: 13.00    Types: Cigarettes  . Smokeless tobacco: Never Used  Substance Use Topics  . Alcohol use: No     Current Outpatient Medications:  .  acetaminophen (TYLENOL) 500 MG tablet, Take 500 mg by mouth every 6 (six) hours as needed., Disp: , Rfl:  .  PREDNISONE PO, Take by mouth., Disp: , Rfl:   No Known Allergies  I personally reviewed active problem list, medication list, allergies, notes from last encounter, lab results with the patient/caregiver today.  ROS  Constitutional: Negative for fever or weight change.   Respiratory: Negative for cough and shortness of breath.   Cardiovascular: Negative for chest pain or palpitations.  Gastrointestinal: Negative for abdominal pain, no bowel changes.  Musculoskeletal: Negative for gait problem or joint swelling.  Skin: Negative for rash.  Neurological: Negative for dizziness or headache.  No other specific complaints in a complete review of systems (except as listed in HPI above).  Objective  Vitals:   06/05/19 1400  BP: 126/86  Pulse: 91  Resp: 16  Temp: 97.7 F (36.5 C)  TempSrc: Temporal  SpO2: 95%  Weight: 275 lb 4.8 oz (124.9 kg)  Height: 5\' 9"  (1.753 m)   Body mass index is 40.65 kg/m.  Nursing Note and Vital Signs reviewed.  Physical Exam  Constitutional: Patient appears well-developed and well-nourished. No distress.  HENT: Head: Normocephalic and atraumatic.  Eyes: Conjunctivae and EOM are normal. No scleral icterus.  Neck: Normal range of motion. Neck supple. No JVD present.  Cardiovascular: Normal rate, regular rhythm and normal heart sounds.  No murmur heard. No BLE edema. Pulmonary/Chest: Effort normal and breath sounds normal. No respiratory distress. Musculoskeletal: Normal range of motion, no joint effusions. No gross deformities Neurological: Pt is alert and oriented to person, place, and time. No cranial nerve deficit. Coordination, balance, strength, speech and gait are normal.  Skin: Skin is warm and dry. No rash noted. No erythema.  Psychiatric: Patient has a normal  mood and affect. behavior is normal. Judgment and thought content normal.  No results found for this or any previous visit (from the past 72 hour(s)).  Assessment & Plan 1. Routine screening for STI (sexually transmitted infection) - Urine cytology ancillary only - RPR - HIV Antibody (routine testing w rflx) - COMPLETE METABOLIC PANEL WITH GFR - Lipid panel - HSV(herpes simplex vrs) 1+2 ab-IgG  2. Lipid screening - Lipid panel  3. Diabetes  mellitus screening - COMPLETE METABOLIC PANEL WITH GFR  -Red flags and when to present for emergency care or RTC including fever >101.46F, chest pain, shortness of breath, new/worsening/un-resolving symptoms, reviewed with patient at time of visit. Follow up and care instructions discussed and provided in AVS.

## 2019-06-06 ENCOUNTER — Encounter: Payer: Self-pay | Admitting: Family Medicine

## 2019-06-06 LAB — LIPID PANEL
Cholesterol: 117 mg/dL (ref <200)
HDL: 27 mg/dL — ABNORMAL LOW (ref >=40)
LDL Cholesterol (Calc): 68 mg/dL (calc)
Non-HDL Cholesterol (Calc): 90 mg/dL (calc) (ref <130)
Total CHOL/HDL Ratio: 4.3 (calc) (ref <5.0)
Triglycerides: 139 mg/dL (ref <150)

## 2019-06-06 LAB — COMPLETE METABOLIC PANEL WITH GFR
AG Ratio: 1.6 (calc) (ref 1.0–2.5)
ALT: 27 U/L (ref 9–46)
AST: 17 U/L (ref 10–40)
Albumin: 4.5 g/dL (ref 3.6–5.1)
Alkaline phosphatase (APISO): 29 U/L — ABNORMAL LOW (ref 36–130)
BUN: 8 mg/dL (ref 7–25)
CO2: 31 mmol/L (ref 20–32)
Calcium: 9.6 mg/dL (ref 8.6–10.3)
Chloride: 103 mmol/L (ref 98–110)
Creat: 0.83 mg/dL (ref 0.60–1.35)
GFR, Est African American: 133 mL/min/{1.73_m2} (ref >=60)
GFR, Est Non African American: 115 mL/min/{1.73_m2} (ref >=60)
Globulin: 2.8 g/dL (calc) (ref 1.9–3.7)
Glucose, Bld: 86 mg/dL (ref 65–99)
Potassium: 4.2 mmol/L (ref 3.5–5.3)
Sodium: 140 mmol/L (ref 135–146)
Total Bilirubin: 0.5 mg/dL (ref 0.2–1.2)
Total Protein: 7.3 g/dL (ref 6.1–8.1)

## 2019-06-06 LAB — RPR: RPR Ser Ql: NONREACTIVE

## 2019-06-06 LAB — HSV(HERPES SIMPLEX VRS) I + II AB-IGG
HAV 1 IGG,TYPE SPECIFIC AB: 46.8 index — ABNORMAL HIGH
HSV 2 IGG,TYPE SPECIFIC AB: <0.9 index

## 2019-06-06 LAB — HIV ANTIBODY (ROUTINE TESTING W REFLEX): HIV 1&2 Ab, 4th Generation: NONREACTIVE

## 2019-06-07 LAB — URINE CYTOLOGY ANCILLARY ONLY
Chlamydia: NEGATIVE
Comment: NEGATIVE
Comment: NORMAL
Neisseria Gonorrhea: NEGATIVE

## 2020-01-18 ENCOUNTER — Other Ambulatory Visit: Payer: Self-pay

## 2020-07-04 ENCOUNTER — Ambulatory Visit: Payer: Self-pay | Admitting: *Deleted

## 2020-07-04 NOTE — Telephone Encounter (Signed)
FYI

## 2020-07-04 NOTE — Telephone Encounter (Signed)
Patient is calling to rpeort he has concerns about CHF- his mother has history of this and he reports he has new onset swelling in his R calf- no swelling in that foot/ankle. 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 Patient states he does get winded while checking his truck before leaving for the day- that has been going on since December. Advised 4 hour disposition- UC so soon as possible- with office follow up.  Reason for Disposition . [1] Thigh, calf, or ankle swelling AND [2] only 1 side  Answer Assessment - Initial Assessment Questions 1. ONSET: "When did the swelling start?" (e.g., minutes, hours, days)     today 2. LOCATION: "What part of the leg is swollen?"  "Are both legs swollen or just one leg?"     Right calf 3. SEVERITY: "How bad is the swelling?" (e.g., localized; mild, moderate, severe)  - Localized - small area of swelling localized to one leg  - MILD pedal edema - swelling limited to foot and ankle, pitting edema < 1/4 inch (6 mm) deep, rest and elevation eliminate most or all swelling  - MODERATE edema - swelling of lower leg to knee, pitting edema > 1/4 inch (6 mm) deep, rest and elevation only partially reduce swelling  - SEVERE edema - swelling extends above knee, facial or hand swelling present      localized 4. REDNESS: "Does the swelling look red or infected?"     no 5. PAIN: "Is the swelling painful to touch?" If Yes, ask: "How painful is it?"   (Scale 1-10; mild, moderate or severe)     no 6. FEVER: "Do you have a fever?" If Yes, ask: "What is it, how was it measured, and when did it start?"      no 7. CAUSE: "What do you think is causing the leg swelling?"     unsure 8. MEDICAL HISTORY: "Do you have a history of heart failure, kidney disease, liver failure, or cancer?"     no 9. RECURRENT SYMPTOM: "Have you had leg swelling before?" If Yes, ask: "When was the last time?" "What happened that time?"     Usually during summer 10.  OTHER SYMPTOMS: "Do you have any other symptoms?" (e.g., chest pain, difficulty breathing)      Weight gain 11. PREGNANCY: "Is there any chance you are pregnant?" "When was your last menstrual period?"       n/a  Protocols used: LEG SWELLING AND EDEMA-A-AH
